# Patient Record
Sex: Female | Born: 1975 | Race: Black or African American | Hispanic: No | Marital: Married | Smoking: Never smoker
Health system: Southern US, Community
[De-identification: ages and names within clinical notes are randomized; demographics above are authoritative.]

## PROBLEM LIST (undated history)

## (undated) DIAGNOSIS — M545 Low back pain, unspecified: Secondary | ICD-10-CM

## (undated) DIAGNOSIS — R7301 Impaired fasting glucose: Secondary | ICD-10-CM

## (undated) DIAGNOSIS — E559 Vitamin D deficiency, unspecified: Secondary | ICD-10-CM

## (undated) DIAGNOSIS — E669 Obesity, unspecified: Secondary | ICD-10-CM

## (undated) DIAGNOSIS — Z803 Family history of malignant neoplasm of breast: Secondary | ICD-10-CM

## (undated) DIAGNOSIS — Z833 Family history of diabetes mellitus: Secondary | ICD-10-CM

## (undated) HISTORY — PX: OTHER SURGICAL HISTORY: SHX169

## (undated) HISTORY — DX: Family history of malignant neoplasm of breast: Z80.3

## (undated) HISTORY — DX: Low back pain, unspecified: M54.50

## (undated) HISTORY — DX: Impaired fasting glucose: R73.01

## (undated) HISTORY — DX: Obesity, unspecified: E66.9

## (undated) HISTORY — DX: Vitamin D deficiency, unspecified: E55.9

## (undated) HISTORY — DX: Low back pain: M54.5

## (undated) HISTORY — PX: PILONIDAL CYST EXCISION: SHX744

## (undated) HISTORY — DX: Morbid (severe) obesity due to excess calories: E66.01

## (undated) HISTORY — DX: Family history of diabetes mellitus: Z83.3

---

## 2011-01-05 ENCOUNTER — Ambulatory Visit: Payer: Self-pay | Admitting: Family Medicine

## 2011-02-05 ENCOUNTER — Ambulatory Visit: Payer: Self-pay | Admitting: Family Medicine

## 2011-05-21 ENCOUNTER — Observation Stay: Payer: Self-pay

## 2011-05-27 ENCOUNTER — Observation Stay: Payer: Self-pay

## 2011-06-02 ENCOUNTER — Inpatient Hospital Stay: Payer: Self-pay

## 2011-06-07 LAB — PATHOLOGY REPORT

## 2011-07-25 ENCOUNTER — Emergency Department: Payer: Self-pay | Admitting: Emergency Medicine

## 2011-08-20 ENCOUNTER — Ambulatory Visit: Payer: Self-pay | Admitting: Surgery

## 2011-08-26 ENCOUNTER — Ambulatory Visit: Payer: Self-pay | Admitting: Surgery

## 2011-08-26 HISTORY — PX: CHOLECYSTECTOMY: SHX55

## 2012-01-29 ENCOUNTER — Emergency Department: Payer: Self-pay | Admitting: *Deleted

## 2012-01-29 LAB — URINALYSIS, COMPLETE
Bilirubin,UR: NEGATIVE
Leukocyte Esterase: NEGATIVE
Ph: 5 (ref 4.5–8.0)
Protein: NEGATIVE
Specific Gravity: 1.021 (ref 1.003–1.030)
WBC UR: 1 /HPF (ref 0–5)

## 2012-01-29 LAB — CBC
MCHC: 33 g/dL (ref 32.0–36.0)
Platelet: 218 10*3/uL (ref 150–440)
RBC: 4.59 10*6/uL (ref 3.80–5.20)

## 2012-01-29 LAB — COMPREHENSIVE METABOLIC PANEL
Albumin: 3.6 g/dL (ref 3.4–5.0)
Alkaline Phosphatase: 84 U/L (ref 50–136)
Anion Gap: 10 (ref 7–16)
BUN: 8 mg/dL (ref 7–18)
Calcium, Total: 9.2 mg/dL (ref 8.5–10.1)
Co2: 22 mmol/L (ref 21–32)
Creatinine: 0.6 mg/dL (ref 0.60–1.30)
EGFR (Non-African Amer.): >60
Glucose: 76 mg/dL (ref 65–99)
Osmolality: 276 (ref 275–301)
SGOT(AST): 15 U/L (ref 15–37)
Sodium: 140 mmol/L (ref 136–145)
Total Protein: 7.8 g/dL (ref 6.4–8.2)

## 2012-02-06 ENCOUNTER — Emergency Department: Payer: Self-pay | Admitting: Emergency Medicine

## 2012-02-06 LAB — URINALYSIS, COMPLETE
Bilirubin,UR: NEGATIVE
Leukocyte Esterase: NEGATIVE
Ph: 6 (ref 4.5–8.0)
Protein: 30
RBC,UR: 240 /HPF (ref 0–5)
Squamous Epithelial: 1
Transitional Epi: 5

## 2012-02-06 LAB — COMPREHENSIVE METABOLIC PANEL
Alkaline Phosphatase: 83 U/L (ref 50–136)
Anion Gap: 11 (ref 7–16)
BUN: 11 mg/dL (ref 7–18)
Chloride: 103 mmol/L (ref 98–107)
EGFR (African American): >60
EGFR (Non-African Amer.): >60
Glucose: 117 mg/dL — ABNORMAL HIGH (ref 65–99)
Osmolality: 272 (ref 275–301)
SGOT(AST): 12 U/L — ABNORMAL LOW (ref 15–37)
SGPT (ALT): 19 U/L
Sodium: 136 mmol/L (ref 136–145)
Total Protein: 8 g/dL (ref 6.4–8.2)

## 2012-02-06 LAB — CBC
MCH: 28.6 pg (ref 26.0–34.0)
MCV: 85 fL (ref 80–100)
RBC: 4.48 10*6/uL (ref 3.80–5.20)
RDW: 14 % (ref 11.5–14.5)
WBC: 10.7 10*3/uL (ref 3.6–11.0)

## 2012-02-06 LAB — HCG, QUANTITATIVE, PREGNANCY: Beta Hcg, Quant.: 8078 m[IU]/mL — ABNORMAL HIGH

## 2012-02-28 ENCOUNTER — Emergency Department: Payer: Self-pay | Admitting: Emergency Medicine

## 2013-02-17 ENCOUNTER — Inpatient Hospital Stay: Payer: Self-pay | Admitting: Obstetrics and Gynecology

## 2013-02-17 LAB — CBC WITH DIFFERENTIAL/PLATELET
Eosinophil #: 0 10*3/uL (ref 0.0–0.7)
Eosinophil %: 0.4 %
HCT: 37 % (ref 35.0–47.0)
Lymphocyte #: 2 10*3/uL (ref 1.0–3.6)
Lymphocyte %: 18.4 %
MCH: 28.3 pg (ref 26.0–34.0)
MCHC: 33.7 g/dL (ref 32.0–36.0)
MCV: 84 fL (ref 80–100)
Monocyte %: 6.9 %
Neutrophil #: 8.2 10*3/uL — ABNORMAL HIGH (ref 1.4–6.5)
Neutrophil %: 74.1 %
WBC: 11.1 10*3/uL — ABNORMAL HIGH (ref 3.6–11.0)

## 2013-03-06 LAB — HM PAP SMEAR: HM Pap smear: NORMAL

## 2014-12-16 LAB — LIPID PANEL
Cholesterol: 144 mg/dL (ref 0–200)
HDL: 63 mg/dL (ref 35–70)
LDL Cholesterol: 69 mg/dL
TRIGLYCERIDES: 61 mg/dL (ref 40–160)

## 2014-12-16 LAB — HEMOGLOBIN A1C: HEMOGLOBIN A1C: 6.3 % — AB (ref 4.0–6.0)

## 2014-12-27 NOTE — Op Note (Signed)
PATIENT NAME:  Bridget Weiss, Torrey R MR#:  [id number] DATE OF BIRTH:  June 08, 1976  DATE OF PROCEDURE:  02/17/2013  PREOPERATIVE DIAGNOSES: Term intrauterine pregnancy, prior history of cesarean section, desire for permanent sterility.   POSTOPERATIVE DIAGNOSES: Term intrauterine pregnancy, prior history of cesarean section, desire for permanent sterility.   PROCEDURE: Low transverse cesarean section, bilateral tubal ligation.   SURGEON: Dierdre Searles. Paul Elham Fini, MD   ASSISTANT: Ronnald Rampammy Gordy, technician.   ANESTHESIA: Spinal.   ESTIMATED BLOOD LOSS: 500 mL.   COMPLICATIONS: None.   FINDINGS: Normal tubes, ovaries and uterus. Viable female infant weighing 8 pounds, 6 ounces with Apgar scores of 9 and 9 at 1 and 5 minutes, respectively.   DISPOSITION: To recovery room in stable condition.   TECHNIQUE: The patient is prepped and draped in the usual sterile fashion and after adequate anesthesia is obtained in the supine position on the Operating Room table, scalpel is used to create a low transverse skin incision down to the level of the rectus fascia, which is then dissected bilaterally using Mayo scissors. The rectus muscles are then dissected away from the rectus fascia and separated in the midline. The peritoneum is penetrated and the bladder is inferiorly retracted. A scalpel is used to create a low transverse hysterotomy incision that is then extended by blunt dissection. Amniotomy reveals light staining with meconium fluid. The infant's head is grasped and delivered without the use of a vacuum device and no nuchal cord is noted. The remaining infant is quickly delivered with the cord clamped and cut and the infant handed to the pediatric team for resuscitative purposes.   Cord blood is obtained. The placenta is manually extracted. The uterus is externalized and cleansed of all membranes and debris using a moist sponge. The hysterotomy incision is closed with a running #1 Vicryl suture in a locking fashion,  followed by a second layer to imbricate the first layer.   The right and left fallopian tubes are grasped in the mid portion with Babcock clamp and a loop is tied with 2 Vicryl sutures, excised and cauterized. Excellent hemostasis is noted.   The uterus is placed back in the intra-abdominal cavity and the paracolic gutters are irrigated using warm saline. Re-examination of all incisions, including tubal incisions, reveals excellent hemostasis. The peritoneum is closed with a Vicryl suture and the fascia is closed with a Maxon suture. Subcutaneous tissues are irrigated and hemostasis is assured using electrocautery. The subcutaneous layer is closed with a plain gut suture and the skin with surgical clips. The patient goes to the recovery room in stable condition. All sponge, instrument and needle counts are correct.    ____________________________ R. Annamarie MajorPaul Jennifr Gaeta, MD rph:jm D: 02/17/2013 07:57:22 ET T: 02/17/2013 15:13:37 ET JOB#: [id number]  cc: Dierdre Searles. Paul Channelle Bottger, MD, <Dictator> Nadara MustardOBERT P Taliah Porche MD ELECTRONICALLY SIGNED 03/05/2013 6:42

## 2015-01-14 NOTE — H&P (Signed)
L&D Evaluation:  History Expanded:  HPI 39 yo G3P1011 AAF at term w estimated date of confinement 02/25/13 w prior Cesarean Section as well as obesity and GDM with scheduled Cesarean Section 6/20 who presents w contractions this am getting stronger and more painful. Denies ROM or vaginal bleeding.  B[facility].   Gravida 3   Term 1   PreTerm 0   Abortion 1   Living 1   Blood Type (Maternal) B positive   Rubella Results (Maternal) equivocal   Presents with contractions   Patient's Medical History Obesity   Patient's Surgical History Pilonidal cyst   Medications Pre Natal Vitamins   Allergies Sulfa   Social History none   Family History Non-Contributory   ROS:  ROS All systems were reviewed.  HEENT, CNS, GI, GU, Respiratory, CV, Renal and Musculoskeletal systems were found to be normal.   Exam:  Vital Signs stable   General no apparent distress   Mental Status clear   Chest clear   Heart normal sinus rhythm, no murmur/gallop/rubs   Abdomen gravid, tender with contractions   Estimated Fetal Weight Average for gestational age   Back no CVAT   Edema no edema   Pelvic no external lesions, cervix closed and thick   Mebranes Intact   FHT normal rate with no decels   Ucx regular   Skin dry   Impression:  Impression early labor   Plan:  Plan EFM/NST, monitor contractions and for cervical change   Comments Pt has been fully informed of the pros and cons, risk/benefits of repeat Cesarean section versus VBAC.  The risks of a repeat Cesarean section include all the risks of major surgery, such as the risk of anesthesia, hemorrhage, infection, injury to adjacent organs-bowel, bladder and blood vessels.  The risks of attempting a VBAC include a 1 in 100 risk of uterine rupture with resultant potentially severe fetal/maternal complications and an increased risk of Cesarean complications should she not be successful.  After carefully weighing the pros and cons, the patient strongly requests a repeat Cesarean section   Electronic Signatures: Letitia LibraHarris, Robert Paul (MD)  (Signed 14-Jun-14 06:22)  Authored: L&D Evaluation   Last Updated: 14-Jun-14 06:22 by Letitia LibraHarris, Robert Paul (MD)

## 2015-07-10 ENCOUNTER — Ambulatory Visit (INDEPENDENT_AMBULATORY_CARE_PROVIDER_SITE_OTHER): Payer: BC Managed Care – PPO | Admitting: Family Medicine

## 2015-07-10 ENCOUNTER — Encounter: Payer: Self-pay | Admitting: Family Medicine

## 2015-07-10 VITALS — BP 134/88 | HR 81 | Temp 98.3°F | Resp 18 | Ht 70.0 in | Wt 311.0 lb

## 2015-07-10 DIAGNOSIS — E669 Obesity, unspecified: Secondary | ICD-10-CM | POA: Insufficient documentation

## 2015-07-10 DIAGNOSIS — H6092 Unspecified otitis externa, left ear: Secondary | ICD-10-CM | POA: Diagnosis not present

## 2015-07-10 DIAGNOSIS — I152 Hypertension secondary to endocrine disorders: Secondary | ICD-10-CM | POA: Insufficient documentation

## 2015-07-10 DIAGNOSIS — M545 Low back pain, unspecified: Secondary | ICD-10-CM | POA: Insufficient documentation

## 2015-07-10 DIAGNOSIS — E559 Vitamin D deficiency, unspecified: Secondary | ICD-10-CM | POA: Insufficient documentation

## 2015-07-10 DIAGNOSIS — Z862 Personal history of diseases of the blood and blood-forming organs and certain disorders involving the immune mechanism: Secondary | ICD-10-CM | POA: Insufficient documentation

## 2015-07-10 DIAGNOSIS — E1169 Type 2 diabetes mellitus with other specified complication: Secondary | ICD-10-CM | POA: Insufficient documentation

## 2015-07-10 DIAGNOSIS — R7301 Impaired fasting glucose: Secondary | ICD-10-CM | POA: Insufficient documentation

## 2015-07-10 DIAGNOSIS — Z833 Family history of diabetes mellitus: Secondary | ICD-10-CM | POA: Insufficient documentation

## 2015-07-10 DIAGNOSIS — Z803 Family history of malignant neoplasm of breast: Secondary | ICD-10-CM | POA: Insufficient documentation

## 2015-07-10 MED ORDER — CIPROFLOXACIN-HYDROCORTISONE 0.2-1 % OT SUSP: 3.0000 [drp] | Freq: Two times a day (BID) | OTIC | Status: DC

## 2015-07-10 NOTE — Progress Notes (Signed)
Name: Bridget Weiss   MRN: [id number]    DOB: Jan 23, 1976   Date:07/10/2015       Progress Note  Subjective  Chief Complaint  Chief Complaint  Patient presents with  . Otalgia    Left Ear Ache Onset- Monday and getting worst, just on left ear and having pressure on left side of jaw. Has tried Tea Tree Oil and put on a cotton ball and put in her ear, and Tylenol.    HPI  Otitis Externa: she states past few days with left ear pain, even to light touch, left side. 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  No fever. Left side feels muffled and also has some crusting on the left side of ear when she woke up. Right now pain is zero, but with pressure can go to 7/10.   Patient Active Problem List   Diagnosis Date Noted  . Elevated fasting blood sugar 07/10/2015  . Family history of breast cancer 07/10/2015  . Family history of diabetes mellitus 07/10/2015  . LBP (low back pain) 07/10/2015  . Morbid obesity (HCC) 07/10/2015  . Vitamin D deficiency 07/10/2015  . History of leukocytosis 07/10/2015    Past Surgical History  Procedure Laterality Date  . Abscess on buttock      had to be drained at the OR  . Cesarean section  06/03/2011  . Cholecystectomy  08/26/2011    Family History  Problem Relation Age of Onset  . Cancer Mother     Breast-Diagnosed in 2011  . Diabetes Mother   . Diabetes Maternal Aunt   . Diabetes Maternal Uncle     Social History   Social History  . Marital Status: Divorced    Spouse Name: N/A  . Number of Children: N/A  . Years of Education: N/A   Occupational History  . Not on file.   Social History Main Topics  . Smoking status: Never Smoker   . Smokeless tobacco: Never Used  . Alcohol Use: 0.0 oz/week    0 Standard drinks or equivalent per week     Comment: occassionally  . Drug Use: No  . Sexual Activity:    Partners: Female    Pharmacist, hospital Protection: Surgical     Comment: Tubal Ligation   Other Topics Concern  . Not on file    Social History Narrative  . No narrative on file     Current outpatient prescriptions:  .  ciprofloxacin-hydrocortisone (CIPRO HC) otic suspension, Place 3 drops into the left ear 2 (two) times daily., Disp: 10 mL, Rfl: 0  Allergies  Allergen Reactions  . Sulfamethoxazole-Trimethoprim Other (See Comments) and Nausea And Vomiting     ROS  Ten systems reviewed and is negative except as mentioned in HPI   Objective  Filed Vitals:   07/10/15 1026  BP: 134/88  Pulse: 81  Temp: 98.3 F (36.8 C)  TempSrc: Oral  Resp: 18  Height:  (1.778 m)  Weight: 311 lb (141.069 kg)  SpO2: 98%    Body mass index is 44.62 kg/(m^2).  Physical Exam  Constitutional: Patient appears well-developed and well-nourished. Obese  No distress.  HEENT: head atraumatic, normocephalic, pupils equal and reactive to light, ears normal TM's, tender during movement , ear canal swollen, tender no redness, mild white spot on superior part of ear canal,  neck supple, negative for tender lymphadenopathy, mild pain with abduction of jaw,  Tonsils are 3 plus bilaterally, no erythema or exsudate Cardiovascular: Normal rate,  regular rhythm and normal heart sounds.  No murmur heard. No BLE edema. Pulmonary/Chest: Effort normal and breath sounds normal. No respiratory distress. Abdominal: Soft.  There is no tenderness. Psychiatric: Patient has a normal mood and affect. behavior is normal. Judgment and thought content normal.  PHQ2/9: Depression screen PHQ 2/9 07/10/2015  Decreased Interest 0  Down, Depressed, Hopeless 0  PHQ - 2 Score 0   Fall Risk: Fall Risk  07/10/2015  Falls in the past year? No    Functional Status Survey: Is the patient deaf or have difficulty hearing?: No Does the patient have difficulty seeing, even when wearing glasses/contacts?: No Does the patient have difficulty concentrating, remembering, or making decisions?: No Does the patient have difficulty walking or climbing  stairs?: No Does the patient have difficulty dressing or bathing?: No Does the patient have difficulty doing errands alone such as visiting a doctor's office or shopping?: No   Assessment & Plan  1. Otitis externa, left  - ciprofloxacin-hydrocortisone (CIPRO HC) otic suspension; Place 3 drops into the left ear 2 (two) times daily.  Dispense: 10 mL; Refill: 0  2. History of leukocytosis  She wants to hold off on checking blood work today

## 2015-07-11 LAB — CBC WITH DIFFERENTIAL/PLATELET
BASOS ABS: 0 10*3/uL (ref 0.0–0.2)
Basos: 0 %
EOS (ABSOLUTE): 0.1 10*3/uL (ref 0.0–0.4)
Eos: 1 %
HEMOGLOBIN: 12.8 g/dL (ref 11.1–15.9)
Hematocrit: 38.1 % (ref 34.0–46.6)
Immature Grans (Abs): 0 10*3/uL (ref 0.0–0.1)
Immature Granulocytes: 0 %
Lymphocytes Absolute: 2 10*3/uL (ref 0.7–3.1)
Lymphs: 24 %
MCH: 27.6 pg (ref 26.6–33.0)
MCHC: 33.6 g/dL (ref 31.5–35.7)
MCV: 82 fL (ref 79–97)
MONOCYTES: 6 %
Monocytes Absolute: 0.5 10*3/uL (ref 0.1–0.9)
Neutrophils Absolute: 5.8 10*3/uL (ref 1.4–7.0)
Neutrophils: 69 %
PLATELETS: 228 10*3/uL (ref 150–379)
RBC: 4.63 x10E6/uL (ref 3.77–5.28)
RDW: 13.5 % (ref 12.3–15.4)
WBC: 8.3 10*3/uL (ref 3.4–10.8)

## 2017-04-27 ENCOUNTER — Emergency Department: Payer: BC Managed Care – PPO

## 2017-04-27 ENCOUNTER — Encounter: Payer: Self-pay | Admitting: Emergency Medicine

## 2017-04-27 ENCOUNTER — Emergency Department
Admission: EM | Admit: 2017-04-27 | Discharge: 2017-04-27 | Disposition: A | Discharge status: Home / Self Care | Payer: BC Managed Care – PPO | Attending: Emergency Medicine | Admitting: Emergency Medicine

## 2017-04-27 DIAGNOSIS — R079 Chest pain, unspecified: Secondary | ICD-10-CM | POA: Diagnosis present

## 2017-04-27 LAB — CBC
HCT: 38.1 % (ref 35.0–47.0)
HEMOGLOBIN: 12.8 g/dL (ref 12.0–16.0)
MCH: 27.7 pg (ref 26.0–34.0)
MCHC: 33.5 g/dL (ref 32.0–36.0)
MCV: 82.8 fL (ref 80.0–100.0)
PLATELETS: 186 10*3/uL (ref 150–440)
RBC: 4.6 MIL/uL (ref 3.80–5.20)
RDW: 14.4 % (ref 11.5–14.5)
WBC: 7 10*3/uL (ref 3.6–11.0)

## 2017-04-27 LAB — COMPREHENSIVE METABOLIC PANEL
ALBUMIN: 3.8 g/dL (ref 3.5–5.0)
ALK PHOS: 66 U/L (ref 38–126)
ALT: 18 U/L (ref 14–54)
ANION GAP: 6 (ref 5–15)
AST: 23 U/L (ref 15–41)
BUN: 11 mg/dL (ref 6–20)
CALCIUM: 8.9 mg/dL (ref 8.9–10.3)
CO2: 24 mmol/L (ref 22–32)
Chloride: 106 mmol/L (ref 101–111)
Creatinine, Ser: 0.49 mg/dL (ref 0.44–1.00)
GFR calc non Af Amer: >60 mL/min (ref >60)
GLUCOSE: 109 mg/dL — AB (ref 65–99)
POTASSIUM: 4.3 mmol/L (ref 3.5–5.1)
SODIUM: 136 mmol/L (ref 135–145)
Total Bilirubin: 0.8 mg/dL (ref 0.3–1.2)
Total Protein: 7.6 g/dL (ref 6.5–8.1)

## 2017-04-27 LAB — LIPASE, BLOOD: Lipase: 27 U/L (ref 11–51)

## 2017-04-27 LAB — FIBRIN DERIVATIVES D-DIMER (ARMC ONLY): FIBRIN DERIVATIVES D-DIMER (ARMC): 481.86 (ref 0.00–499.00)

## 2017-04-27 LAB — TROPONIN I: Troponin I: <0.03 ng/mL (ref <0.03)

## 2017-04-27 MED ORDER — FAMOTIDINE 20 MG PO TABS: 20.0000 mg | ORAL_TABLET | Freq: Two times a day (BID) | ORAL | 1 refills | Status: DC

## 2017-04-27 MED ORDER — OXYCODONE-ACETAMINOPHEN 5-325 MG PO TABS
2.0000 | ORAL_TABLET | Freq: Once | ORAL | Status: AC
  Administered 2017-04-27: 2 via ORAL
  Filled 2017-04-27: qty 2

## 2017-04-27 NOTE — ED Notes (Signed)
Patient transported to radiology at this time. 

## 2017-04-27 NOTE — ED Notes (Signed)
Attempted IV access x1. Vernona Rieger, RN attempted IV access x1. Kathlene November, ED Medic and Orvilla Fus, ED Medic at bedside to attempt IV access.

## 2017-04-27 NOTE — ED Notes (Signed)
Medics attempted IV access x2. Unsuccessful. Will wait until blood work results until further attempts. IV [place] will be required if so.

## 2017-04-27 NOTE — ED Notes (Signed)
Lab called and notified of need of blood draw. States they will send someone down as soon as possible.

## 2017-04-27 NOTE — ED Triage Notes (Signed)
Patient presents to ED via [facility] from home with c/o sudden onset of CP that began at 0700 this morning. Patient reports pressure under left breast that radiates into back. A&O x4. Patient states the pain started when he was carrying her 39 year old daughter.

## 2017-04-27 NOTE — ED Provider Notes (Signed)
[facility] Emergency Department Provider Note       Time seen: ----------------------------------------- 7:50 AM on 04/27/2017 -----------------------------------------     I have reviewed the triage vital signs and the nursing notes.   HISTORY   Chief Complaint Chest Pain    HPI Bridget Weiss is a 41 y.o. female who presents to the ED for sudden onset of chest pain that began around 7:00 this morning. She describes as pressure that radiates from the left side of her chest more to the center of her chest. She also has had it radiate into the back. She's never had it happen before, movement seems to make it worse. She states the pain started while she was carrying her 28-year-old daughter. She denies any recent illness or other complaints.   Past Medical History:  Diagnosis Date  . Abnormal fasting glucose   . Family history of breast cancer   . Family history of diabetes mellitus   . High blood pressure   . Intermittent low back pain   . Obesity, morbid (HCC)   . Vitamin D deficiency     Patient Active Problem List   Diagnosis Date Noted  . Elevated fasting blood sugar 07/10/2015  . Family history of breast cancer 07/10/2015  . Family history of diabetes mellitus 07/10/2015  . LBP (low back pain) 07/10/2015  . Morbid obesity (HCC) 07/10/2015  . Vitamin D deficiency 07/10/2015  . History of leukocytosis 07/10/2015    Past Surgical History:  Procedure Laterality Date  . abscess on buttock     had to be drained at the OR  . CESAREAN SECTION  06/03/2011  . CHOLECYSTECTOMY  08/26/2011    Allergies Sulfamethoxazole-trimethoprim  Social History Social History  Substance Use Topics  . Smoking status: Never Smoker  . Smokeless tobacco: Never Used  . Alcohol use 0.0 oz/week     Comment: occassionally    Review of Systems Constitutional: Negative for fever. Eyes: Negative for vision changes ENT:  Negative for congestion, sore  throat Cardiovascular: Positive for chest pain Respiratory: Negative for shortness of breath. Gastrointestinal: Negative for abdominal pain, vomiting and diarrhea. Genitourinary: Negative for dysuria. Musculoskeletal: Negative for back pain. Skin: Negative for rash. Neurological: Negative for headaches, focal weakness or numbness.  All systems negative/normal/unremarkable except as stated in the HPI  ____________________________________________   PHYSICAL EXAM:  VITAL SIGNS: ED Triage Vitals  Enc Vitals Group     BP 04/27/17 0745 (!) 156/104     Pulse Rate 04/27/17 0745 78     Resp 04/27/17 0745 17     Temp 04/27/17 0749 98.4 F (36.9 C)     Temp Source 04/27/17 0749 Oral     SpO2 04/27/17 0745 97 %     Weight 04/27/17 0746 (!) 310 lb (140.6 kg)     Height 04/27/17 0746 5\' 9"  (1.753 m)     Head Circumference --      Peak Flow --      Pain Score 04/27/17 0745 5     Pain Loc --      Pain Edu? --      Excl. in GC? --     Constitutional: Alert and oriented. Well appearing and in no distress. Eyes: Conjunctivae are normal. Normal extraocular movements. ENT   Head: Normocephalic and atraumatic.   Nose: No congestion/rhinnorhea.   Mouth/Throat: Mucous membranes are moist.   Neck: No stridor. Cardiovascular: Normal rate, regular rhythm. No murmurs, rubs, or gallops. Respiratory: Normal respiratory  effort without tachypnea nor retractions. Breath sounds are clear and equal bilaterally. No wheezes/rales/rhonchi. Gastrointestinal: Soft and nontender. Normal bowel sounds Musculoskeletal: Nontender with normal range of motion in extremities. No lower extremity tenderness nor edema. Neurologic:  Normal speech and language. No gross focal neurologic deficits are appreciated.  Skin:  Skin is warm, dry and intact. No rash noted. Psychiatric: Mood and affect are normal. Speech and behavior are normal.  ____________________________________________  EKG: Interpreted by  me.Sinus rhythm rate of 74 bpm, normal PR interval, normal QRS, normal QT.  ____________________________________________  ED COURSE:  Pertinent labs & imaging results that were available during my care of the patient were reviewed by me and considered in my medical decision making (see chart for details). Patient presents for chest pain, we will assess with labs and imaging as indicated.   Procedures ____________________________________________   LABS (pertinent positives/negatives)  Labs Reviewed  COMPREHENSIVE METABOLIC PANEL - Abnormal; Notable for the following:       Result Value   Glucose, Bld 109 (*)    All other components within normal limits  TROPONIN I  LIPASE, BLOOD  CBC  FIBRIN DERIVATIVES D-DIMER ([facility] ONLY)    RADIOLOGY  Chest x-ray Is unremarkable ____________________________________________  FINAL ASSESSMENT AND PLAN  Chest pain  Plan: Patient's labs and imaging were dictated above. Patient had presented for chest pain of uncertain etiology. Labs and workup here were reassuring and she currently has resolution of her symptoms. She is stable for outpatient follow-up.   Emily Filbert, MD   Note: This note was generated in part or whole with voice recognition software. Voice recognition is usually quite accurate but there are transcription errors that can and very often do occur. I apologize for any typographical errors that were not detected and corrected.     Emily Filbert, MD 04/27/17 1048

## 2017-04-29 ENCOUNTER — Ambulatory Visit (INDEPENDENT_AMBULATORY_CARE_PROVIDER_SITE_OTHER): Payer: BC Managed Care – PPO | Admitting: Family Medicine

## 2017-04-29 ENCOUNTER — Encounter: Payer: Self-pay | Admitting: Family Medicine

## 2017-04-29 VITALS — BP 130/76 | HR 90 | Temp 98.0°F | Resp 16 | Ht 70.0 in | Wt 313.3 lb

## 2017-04-29 DIAGNOSIS — R739 Hyperglycemia, unspecified: Secondary | ICD-10-CM | POA: Diagnosis not present

## 2017-04-29 DIAGNOSIS — R0789 Other chest pain: Secondary | ICD-10-CM

## 2017-04-29 NOTE — Patient Instructions (Signed)

## 2017-04-29 NOTE — Progress Notes (Signed)
Name: Bridget Weiss   MRN: [id number]    DOB: Oct 30, 1975   Date:04/29/2017       Progress Note  Subjective  Chief Complaint  Chief Complaint  Patient presents with  . Hospitalization Follow-up    follow up for chest pain      HPI  Chest pain: she went to [facility] on 08/22 after acute onset of severe left side chest pain that went around her chest, when she picked up her 39 yo daughter. It was continues and intense, not associated with diaphoresis or nausea or vomiting, pain was aggravated by movement and had some bloating when pain first happened. At [facility] labs were negative, normal troponin, D-dimer, CBC, lipase and kidney and liver function. CXR and EKG's also normal. She was given two of percocet at [facility] and symptoms resolved, and was told to take Pepcid at home. She states right now she has mild discomfort but feeling much better.   Obesity/ she has hyperglycemia and a long history of obesity, she denies polyphagia, polydipsia or polyuria. She tries to have heaviest meal for lunch. Eating smaller portions at night.   Patient Active Problem List   Diagnosis Date Noted  . Elevated fasting blood sugar 07/10/2015  . Family history of breast cancer 07/10/2015  . Family history of diabetes mellitus 07/10/2015  . Morbid obesity ([place]) 07/10/2015  . Vitamin D deficiency 07/10/2015    Past Surgical History:  Procedure Laterality Date  . abscess on buttock     had to be drained at the OR  . CESAREAN SECTION  06/03/2011  . CHOLECYSTECTOMY  08/26/2011    Family History  Problem Relation Age of Onset  . Cancer Mother        Breast-Diagnosed in 2011  . Diabetes Mother   . Diabetes Maternal Aunt   . Diabetes Maternal Uncle     Social History   Social History  . Marital status: Married    Spouse name: Antonio   . Number of children: 2  . Years of education: N/A   Occupational History  . administrative specialist [employer]   Social History Main Topics  . Smoking status: Never Smoker  .  Smokeless tobacco: Never Used  . Alcohol use 0.0 oz/week     Comment: occassionally  . Drug use: No  . Sexual activity: Yes    Partners: Female    Birth control/ protection: Surgical     Comment: Tubal Ligation   Other Topics Concern  . Not on file   Social History Narrative   Re-married, her husband has two children from a previous relationship   They have two children together   Works at [employer] Prescriptions:  .  famotidine (PEPCID) 20 MG tablet, Take 1 tablet (20 mg total) by mouth 2 (two) times daily., Disp: 60 tablet, Rfl: 1 .  ciprofloxacin-hydrocortisone (CIPRO HC) otic suspension, Place 3 drops into the left ear 2 (two) times daily. (Patient not taking: Reported on 04/27/2017), Disp: 10 mL, Rfl: 0  Allergies  Allergen Reactions  . Sulfamethoxazole-Trimethoprim Other (See Comments) and Nausea And Vomiting     ROS  Constitutional: Negative for fever or weight change.  Respiratory: Negative for cough and shortness of breath.   Cardiovascular: Negative for chest pain ( intermittent discomfort)  or palpitations.  Gastrointestinal: Negative for abdominal pain, no bowel changes.  Musculoskeletal: Negative for gait problem or joint swelling.  Skin: Negative for rash.  Neurological: Negative for dizziness or headache.  No other specific complaints in a complete review of systems (except as listed in HPI above).  Objective  Vitals:   04/29/17 0812  BP: 130/76  Pulse: 90  Resp: 16  Temp: 98 F (36.7 C)  SpO2: 98%  Weight: (!) 313 lb 5 oz (142.1 kg)  Height: _0  (1.778 m)    Body mass index is 44.96 kg/m.  Physical Exam  Constitutional: Patient appears well-developed and well-nourished. Obese  No distress.  HEENT: head atraumatic, normocephalic, pupils equal and reactive to light,  neck supple, throat within normal limits Cardiovascular: Normal rate, regular rhythm and normal heart sounds.  No murmur heard. No BLE edema. Pulmonary/Chest:  Effort normal and breath sounds normal. No respiratory distress. Abdominal: Soft.  There is no tenderness. Psychiatric: Patient has a normal mood and affect. behavior is normal. Judgment and thought content normal. Muscular Skeletal: pain during palpation of left side of chest, from anterior axillary line to left anterior chest wall  Recent Results (from the past 2160 hour(s))  Troponin I     Status: None   Collection Time: 04/27/17  8:04 AM  Result Value Ref Range   Troponin I <0.03 <0.03 ng/mL  Comprehensive metabolic panel     Status: Abnormal   Collection Time: 04/27/17  8:04 AM  Result Value Ref Range   Sodium 136 135 - 145 mmol/L   Potassium 4.3 3.5 - 5.1 mmol/L   Chloride 106 101 - 111 mmol/L   CO2 24 22 - 32 mmol/L   Glucose, Bld 109 (H) 65 - 99 mg/dL   BUN 11 6 - 20 mg/dL   Creatinine, Ser 0.49 0.44 - 1.00 mg/dL   Calcium 8.9 8.9 - 10.3 mg/dL   Total Protein 7.6 6.5 - 8.1 g/dL   Albumin 3.8 3.5 - 5.0 g/dL   AST 23 15 - 41 U/L   ALT 18 14 - 54 U/L   Alkaline Phosphatase 66 38 - 126 U/L   Total Bilirubin 0.8 0.3 - 1.2 mg/dL   GFR calc non Af Amer >60 >60 mL/min   GFR calc Af Amer >60 >60 mL/min    Comment: (NOTE) The eGFR has been calculated using the CKD EPI equation. This calculation has not been validated in all clinical situations. eGFR's persistently <60 mL/min signify possible Chronic Kidney Disease.    Anion gap 6 5 - 15  Lipase, blood     Status: None   Collection Time: 04/27/17  8:04 AM  Result Value Ref Range   Lipase 27 11 - 51 U/L  CBC     Status: None   Collection Time: 04/27/17  9:42 AM  Result Value Ref Range   WBC 7.0 3.6 - 11.0 K/uL   RBC 4.60 3.80 - 5.20 MIL/uL   Hemoglobin 12.8 12.0 - 16.0 g/dL   HCT 38.1 35.0 - 47.0 %   MCV 82.8 80.0 - 100.0 fL   MCH 27.7 26.0 - 34.0 pg   MCHC 33.5 32.0 - 36.0 g/dL   RDW 14.4 11.5 - 14.5 %   Platelets 186 150 - 440 K/uL  Fibrin derivatives D-Dimer ([facility] only)     Status: None   Collection Time: 04/27/17   9:42 AM  Result Value Ref Range   Fibrin derivatives D-dimer ([facility]) 481.86 0.00 - 499.00    Comment: (NOTE) <> Exclusion of Venous Thromboembolism (VTE) - OUTPATIENT ONLY   (Emergency Department or [place])   0-499 ng/ml (FEU): With a low to intermediate pretest probability  for VTE this test result excludes the diagnosis                      of VTE.   >499 ng/ml (FEU) : VTE not excluded; additional work up for VTE is                      required. <> Testing on Inpatients and Evaluation of Disseminated Intravascular   Coagulation (DIC) Reference Range:   0-499 ng/ml (FEU)       PHQ2/9: Depression screen [facility] 2/9 04/29/2017 07/10/2015  Decreased Interest 0 0  Down, Depressed, Hopeless 0 0  PHQ - 2 Score 0 0     Fall Risk: Fall Risk  04/29/2017 07/10/2015  Falls in the past year? No No     Functional Status Survey: Is the patient deaf or have difficulty hearing?: No Does the patient have difficulty seeing, even when wearing glasses/contacts?: No Does the patient have difficulty concentrating, remembering, or making decisions?: No Does the patient have difficulty walking or climbing stairs?: No Does the patient have difficulty dressing or bathing?: No Does the patient have difficulty doing errands alone such as visiting a doctor's office or shopping?: No   Assessment & Plan   1. Other chest pain  Continue pepcid since it seems to have improved, it seems to be muscular, reproducible with palpitation, reviewed chart with patient, continue to monitor for now  2. Morbid obesity ([place])  Discussed with the patient the risk posed by an increased BMI. Discussed importance of portion control, calorie counting and at least 150 minutes of physical activity weekly. Avoid sweet beverages and drink more water. Eat at least 6 servings of fruit and vegetables daily   3. Hyperglycemia  - Hemoglobin A1c - Lipid panel - Insulin, fasting

## 2017-05-30 ENCOUNTER — Telehealth: Payer: Self-pay | Admitting: Family Medicine

## 2017-05-30 ENCOUNTER — Other Ambulatory Visit: Payer: Self-pay | Admitting: Family Medicine

## 2017-05-30 NOTE — Telephone Encounter (Signed)
Patient notified and labs are upfront. Ready for patient to have drawn.

## 2017-05-30 NOTE — Telephone Encounter (Signed)
I already ordered labs, they are probably pending.  I prefer ordering mammogram during her visit , just in case she has a lump

## 2017-05-30 NOTE — Telephone Encounter (Signed)
Pt has her annual physical for next week. She would like for you to order her lab work and mammogram before the appt.  [phone]

## 2017-06-04 LAB — HEMOGLOBIN A1C
Hgb A1c MFr Bld: 6 % of total Hgb — ABNORMAL HIGH (ref <5.7)
Mean Plasma Glucose: 126 (calc)
eAG (mmol/L): 7 (calc)

## 2017-06-04 LAB — LIPID PANEL
CHOL/HDL RATIO: 3 (calc) (ref <5.0)
CHOLESTEROL: 158 mg/dL (ref <200)
HDL: 53 mg/dL (ref >50)
LDL Cholesterol (Calc): 91 mg/dL (calc)
NON-HDL CHOLESTEROL (CALC): 105 mg/dL (ref <130)
TRIGLYCERIDES: 54 mg/dL (ref <150)

## 2017-06-06 LAB — INSULIN, RANDOM: INSULIN: 8 u[IU]/mL (ref 2.0–19.6)

## 2017-06-08 ENCOUNTER — Encounter: Payer: Self-pay | Admitting: Family Medicine

## 2017-06-08 ENCOUNTER — Ambulatory Visit (INDEPENDENT_AMBULATORY_CARE_PROVIDER_SITE_OTHER): Payer: BC Managed Care – PPO | Admitting: Family Medicine

## 2017-06-08 VITALS — BP 100/60 | HR 84 | Temp 98.6°F | Resp 14 | Ht 70.0 in | Wt 316.7 lb

## 2017-06-08 DIAGNOSIS — Z01419 Encounter for gynecological examination (general) (routine) without abnormal findings: Secondary | ICD-10-CM

## 2017-06-08 DIAGNOSIS — Z1239 Encounter for other screening for malignant neoplasm of breast: Secondary | ICD-10-CM

## 2017-06-08 DIAGNOSIS — R7303 Prediabetes: Secondary | ICD-10-CM

## 2017-06-08 DIAGNOSIS — Z1231 Encounter for screening mammogram for malignant neoplasm of breast: Secondary | ICD-10-CM | POA: Diagnosis not present

## 2017-06-08 DIAGNOSIS — Z803 Family history of malignant neoplasm of breast: Secondary | ICD-10-CM

## 2017-06-08 DIAGNOSIS — Z124 Encounter for screening for malignant neoplasm of cervix: Secondary | ICD-10-CM | POA: Diagnosis not present

## 2017-06-08 DIAGNOSIS — Z113 Encounter for screening for infections with a predominantly sexual mode of transmission: Secondary | ICD-10-CM

## 2017-06-08 MED ORDER — VITAMIN D 50 MCG (2000 UT) PO CAPS: 1.0000 | ORAL_CAPSULE | Freq: Every day | ORAL | 0 refills | Status: DC

## 2017-06-08 NOTE — Patient Instructions (Signed)
Diabetes Mellitus and Food It is important for you to manage your blood sugar (glucose) level. Your blood glucose level can be greatly affected by what you eat. Eating healthier foods in the appropriate amounts throughout the day at about the same time each day will help you control your blood glucose level. It can also help slow or prevent worsening of your diabetes mellitus. Healthy eating may even help you improve the level of your blood pressure and reach or maintain a healthy weight. General recommendations for healthful eating and cooking habits include:  Eating meals and snacks regularly. Avoid going long periods of time without eating to lose weight.  Eating a diet that consists mainly of plant-based foods, such as fruits, vegetables, nuts, legumes, and whole grains.  Using low-heat cooking methods, such as baking, instead of high-heat cooking methods, such as deep frying.  Work with your dietitian to make sure you understand how to use the Nutrition Facts information on food labels. How can food affect me? Carbohydrates Carbohydrates affect your blood glucose level more than any other type of food. Your dietitian will help you determine how many carbohydrates to eat at each meal and teach you how to count carbohydrates. Counting carbohydrates is important to keep your blood glucose at a healthy level, especially if you are using insulin or taking certain medicines for diabetes mellitus. Alcohol Alcohol can cause sudden decreases in blood glucose (hypoglycemia), especially if you use insulin or take certain medicines for diabetes mellitus. Hypoglycemia can be a life-threatening condition. Symptoms of hypoglycemia (sleepiness, dizziness, and disorientation) are similar to symptoms of having too much alcohol. If your health care provider has given you approval to drink alcohol, do so in moderation and use the following guidelines:  Women should not have more than one drink per day, and men  should not have more than two drinks per day. One drink is equal to: ? 12 oz of beer. ? 5 oz of wine. ? 1 oz of hard liquor.  Do not drink on an empty stomach.  Keep yourself hydrated. Have water, diet soda, or unsweetened iced tea.  Regular soda, juice, and other mixers might contain a lot of carbohydrates and should be counted.  What foods are not recommended? As you make food choices, it is important to remember that all foods are not the same. Some foods have fewer nutrients per serving than other foods, even though they might have the same number of calories or carbohydrates. It is difficult to get your body what it needs when you eat foods with fewer nutrients. Examples of foods that you should avoid that are high in calories and carbohydrates but low in nutrients include:  Trans fats (most processed foods list trans fats on the Nutrition Facts label).  Regular soda.  Juice.  Candy.  Sweets, such as cake, pie, doughnuts, and cookies.  Fried foods.  What foods can I eat? Eat nutrient-rich foods, which will nourish your body and keep you healthy. The food you should eat also will depend on several factors, including:  The calories you need.  The medicines you take.  Your weight.  Your blood glucose level.  Your blood pressure level.  Your cholesterol level.  You should eat a variety of foods, including:  Protein. ? Lean cuts of meat. ? Proteins low in saturated fats, such as fish, egg whites, and beans. Avoid processed meats.  Fruits and vegetables. ? Fruits and vegetables that may help control blood glucose levels, such as apples,   mangoes, and yams.  Dairy products. ? Choose fat-free or low-fat dairy products, such as milk, yogurt, and cheese.  Grains, bread, pasta, and rice. ? Choose whole grain products, such as multigrain bread, whole oats, and brown rice. These foods may help control blood pressure.  Fats. ? Foods containing healthful fats, such as  nuts, avocado, olive oil, canola oil, and fish.  Does everyone with diabetes mellitus have the same meal plan? Because every person with diabetes mellitus is different, there is not one meal plan that works for everyone. It is very important that you meet with a dietitian who will help you create a meal plan that is just right for you. This information is not intended to replace advice given to you by your health care provider. Make sure you discuss any questions you have with your health care provider. Document Released: 05/20/2005 Document Revised: 01/29/2016 Document Reviewed: 07/20/2013 Elsevier Interactive Patient Education  2017 [place] 18-39 Years, Female Preventive care refers to lifestyle choices and visits with your health care provider that can promote health and wellness. What does preventive care include?  A yearly physical exam. This is also called an annual well check.  Dental exams once or twice a year.  Routine eye exams. Ask your health care provider how often you should have your eyes checked.  Personal lifestyle choices, including: ? Daily care of your teeth and gums. ? Regular physical activity. ? Eating a healthy diet. ? Avoiding tobacco and drug use. ? Limiting alcohol use. ? Practicing safe sex. ? Taking vitamin and mineral supplements as recommended by your health care provider. What happens during an annual well check? The services and screenings done by your health care provider during your annual well check will depend on your age, overall health, lifestyle risk factors, and family history of disease. Counseling Your health care provider may ask you questions about your:  Alcohol use.  Tobacco use.  Drug use.  Emotional well-being.  Home and relationship well-being.  Sexual activity.  Eating habits.  Work and work Statistician.  Method of birth control.  Menstrual cycle.  Pregnancy history.  Screening You may have  the following tests or measurements:  Height, weight, and BMI.  Diabetes screening. This is done by checking your blood sugar (glucose) after you have not eaten for a while (fasting).  Blood pressure.  Lipid and cholesterol levels. These may be checked every 5 years starting at age 14.  Skin check.  Hepatitis C blood test.  Hepatitis B blood test.  Sexually transmitted disease (STD) testing.  BRCA-related cancer screening. This may be done if you have a family history of breast, ovarian, tubal, or peritoneal cancers.  Pelvic exam and Pap test. This may be done every 3 years starting at age 63. Starting at age 65, this may be done every 5 years if you have a Pap test in combination with an HPV test.  Discuss your test results, treatment options, and if necessary, the need for more tests with your health care provider. Vaccines Your health care provider may recommend certain vaccines, such as:  Influenza vaccine. This is recommended every year.  Tetanus, diphtheria, and acellular pertussis (Tdap, Td) vaccine. You may need a Td booster every 10 years.  Varicella vaccine. You may need this if you have not been vaccinated.  HPV vaccine. If you are 97 or younger, you may need three doses over 6 months.  Measles, mumps, and rubella (MMR) vaccine. You may need at  least one dose of MMR. You may also need a second dose.  Pneumococcal 13-valent conjugate (PCV13) vaccine. You may need this if you have certain conditions and were not previously vaccinated.  Pneumococcal polysaccharide (PPSV23) vaccine. You may need one or two doses if you smoke cigarettes or if you have certain conditions.  Meningococcal vaccine. One dose is recommended if you are age 33-21 years and a first-year college student living in a residence hall, or if you have one of several medical conditions. You may also need additional booster doses.  Hepatitis A vaccine. You may need this if you have certain conditions or  if you travel or work in places where you may be exposed to hepatitis A.  Hepatitis B vaccine. You may need this if you have certain conditions or if you travel or work in places where you may be exposed to hepatitis B.  Haemophilus influenzae type b (Hib) vaccine. You may need this if you have certain risk factors.  Talk to your health care provider about which screenings and vaccines you need and how often you need them. This information is not intended to replace advice given to you by your health care provider. Make sure you discuss any questions you have with your health care provider. Document Released: 10/19/2001 Document Revised: 05/12/2016 Document Reviewed: 06/24/2015 Elsevier Interactive Patient Education  2017 [employer].

## 2017-06-08 NOTE — Progress Notes (Addendum)
Name: Bridget Weiss   MRN: [id number]    DOB: Apr 13, 1976   Date:06/08/2017       Progress Note  Subjective  Chief Complaint  Chief Complaint  Patient presents with  . Annual Exam    HPI   Patient presents for annual CPE and review labs  Diet: she tries to eat lots of vegetables, she eats at work but tries to eat grilled food and vegetables/fruit, avoiding sweets  Current Exercise Habits: The patient does not participate in regular exercise at present     USPSTF grade A and B recommendations  Depression:  Depression screen [facility] 2/9 06/08/2017 04/29/2017 07/10/2015  Decreased Interest 0 0 0  Down, Depressed, Hopeless 0 0 0  PHQ - 2 Score 0 0 0  Altered sleeping 0 - -  Tired, decreased energy 0 - -  Change in appetite 0 - -  Feeling bad or failure about yourself  0 - -  Trouble concentrating 0 - -  Moving slowly or fidgety/restless 0 - -  Suicidal thoughts 0 - -  PHQ-9 Score 0 - -  Difficult doing work/chores Not difficult at all - -   Hypertension: BP Readings from Last 3 Encounters:  06/08/17 100/60  04/29/17 130/76  04/27/17 136/88   Obesity: Wt Readings from Last 3 Encounters:  06/08/17 (!) 316 lb 11.2 oz (143.7 kg)  04/29/17 (!) 313 lb 5 oz (142.1 kg)  04/27/17 (!) 310 lb (140.6 kg)   BMI Readings from Last 3 Encounters:  06/08/17 45.44 kg/m  04/29/17 44.96 kg/m  04/27/17 45.78 kg/m    Alcohol: only on weekends, two servings. Tobacco use: none HIV, hep B, hep C: she is sexually active and would like to have labs done STD testing and prevention (chl/gon/syphilis): we will check it Intimate partner violence:  Sexual History/Pain during Intercourse: Menstrual History/LMP: 05/28/2017, cycles are monthly but at times heavy and other times light.  Incontinence Symptoms: denies   Advanced Care Planning: A voluntary discussion about advance care planning including the explanation and discussion of advance directives.  Discussed health care proxy and Living  will, and the patient was able to identify a health care proxy as husband  Patient does not have a living will at present time. If patient does have living will, I have requested they bring this to the clinic to be scanned in to their chart.  Breast cancer: due for mammogram 10/2017 No results found for: [facility]  BRCA gene screening: she will find out if mother was tested before she died Cervical cancer screening: check today  Fall prevention/vitamin D: advised to resume supplementation  Lipids:  Lab Results  Component Value Date   CHOL 158 06/03/2017   CHOL 144 12/16/2014   Lab Results  Component Value Date   HDL 53 06/03/2017   HDL 63 12/16/2014   Lab Results  Component Value Date   LDLCALC 69 12/16/2014   Lab Results  Component Value Date   TRIG 54 06/03/2017   TRIG 61 12/16/2014   Lab Results  Component Value Date   CHOLHDL 3.0 06/03/2017   No results found for: LDLDIRECT  Glucose:  Glucose  Date Value Ref Range Status  02/06/2012 117 (H) 65 - 99 mg/dL Final  01/29/2012 76 65 - 99 mg/dL Final   Glucose, Bld  Date Value Ref Range Status  04/27/2017 109 (H) 65 - 99 mg/dL Final    Skin cancer: no atypical lesion Colorectal cancer: not due, too young,   years  old. Lung cancer:  Low Dose CT Chest recommended if Age 26-80 years, 30 pack-year currently smoking OR have quit w/in 15years. Patient does not qualify.   Aspirin: not a candidate   Patient Active Problem List   Diagnosis Date Noted  . Elevated fasting blood sugar 07/10/2015  . Family history of breast cancer 07/10/2015  . Family history of diabetes mellitus 07/10/2015  . Morbid obesity ([place]) 07/10/2015  . Vitamin D deficiency 07/10/2015    Past Surgical History:  Procedure Laterality Date  . abscess on buttock     had to be drained at the OR  . CESAREAN SECTION  06/03/2011  . CHOLECYSTECTOMY  08/26/2011    Family History  Problem Relation Age of Onset  . Cancer Mother         Breast-Diagnosed in 2011  . Diabetes Mother   . Diabetes Maternal Aunt   . Diabetes Maternal Uncle     Social History   Social History  . Marital status: Married    Spouse name: Antonio   . Number of children: 2  . Years of education: N/A   Occupational History  . administrative specialist [employer]   Social History Main Topics  . Smoking status: Never Smoker  . Smokeless tobacco: Never Used  . Alcohol use 0.0 oz/week     Comment: occassionally  . Drug use: No  . Sexual activity: Yes    Partners: Female    Birth control/ protection: Surgical     Comment: Tubal Ligation   Other Topics Concern  . Not on file   Social History Narrative   Re-married, her husband has two children from a previous relationship   They have two children together   Works at [employer] Prescriptions:  .  Cholecalciferol (VITAMIN D) 2000 units CAPS, Take 1 capsule (2,000 Units total) by mouth daily., Disp: 30 capsule, Rfl: 0  Allergies  Allergen Reactions  . Sulfamethoxazole-Trimethoprim Other (See Comments) and Nausea And Vomiting     ROS   Constitutional: Negative for fever or weight change.  Respiratory: Negative for cough and shortness of breath.   Cardiovascular: Negative for chest pain or palpitations.  Gastrointestinal: Negative for abdominal pain, no bowel changes.  Musculoskeletal: Negative for gait problem or joint swelling.  Skin: Negative for rash.  Neurological: Negative for dizziness or headache.  No other specific complaints in a complete review of systems (except as listed in HPI above).   Objective  Vitals:   06/08/17 1115  BP: 100/60  Pulse: 84  Resp: 14  Temp: 98.6 F (37 C)  TempSrc: Oral  SpO2: 98%  Weight: (!) 316 lb 11.2 oz (143.7 kg)  Height: '5\' 10"'  (1.778 m)    Body mass index is 45.44 kg/m.  Physical Exam  Constitutional: Patient appears well-developed and well-nourished. No distress.  HENT: Head: Normocephalic and atraumatic.  Ears: B TMs ok, no erythema or effusion; Nose: Nose normal. Sty on left eye  Mouth/Throat: Oropharynx is clear and moist. No oropharyngeal exudate.  Eyes: Conjunctivae and EOM are normal. Pupils are equal, round, and reactive to light. No scleral icterus.  Neck: Normal range of motion. Neck supple. No JVD present. No thyromegaly present.  Cardiovascular: Normal rate, regular rhythm and normal heart sounds.  No murmur heard. No BLE edema. Pulmonary/Chest: Effort normal and breath sounds normal. No respiratory distress. Abdominal: Soft. Bowel sounds are normal, no distension. There is no tenderness. no masses Breast: no lumps or masses, no nipple  discharge or rashes FEMALE GENITALIA:  External genitalia normal External urethra normal Vaginal vault normal without discharge or lesions Cervix normal without discharge or lesions Bimanual exam normal without masses RECTAL: not done Musculoskeletal: Normal range of motion, no joint effusions. No gross deformities Neurological: he is alert and oriented to person, place, and time. No cranial nerve deficit. Coordination, balance, strength, speech and gait are normal.  Skin: Skin is warm and dry. No rash noted. No erythema.  Psychiatric: Patient has a normal mood and affect. behavior is normal. Judgment and thought content normal.   Recent Results (from the past 2160 hour(s))  Troponin I     Status: None   Collection Time: 04/27/17  8:04 AM  Result Value Ref Range   Troponin I <0.03 <0.03 ng/mL  Comprehensive metabolic panel     Status: Abnormal   Collection Time: 04/27/17  8:04 AM  Result Value Ref Range   Sodium 136 135 - 145 mmol/L   Potassium 4.3 3.5 - 5.1 mmol/L   Chloride 106 101 - 111 mmol/L   CO2 24 22 - 32 mmol/L   Glucose, Bld 109 (H) 65 - 99 mg/dL   BUN 11 6 - 20 mg/dL   Creatinine, Ser 0.49 0.44 - 1.00 mg/dL   Calcium 8.9 8.9 - 10.3 mg/dL   Total Protein 7.6 6.5 - 8.1 g/dL   Albumin 3.8 3.5 - 5.0 g/dL   AST 23 15 - 41 U/L   ALT  18 14 - 54 U/L   Alkaline Phosphatase 66 38 - 126 U/L   Total Bilirubin 0.8 0.3 - 1.2 mg/dL   GFR calc non Af Amer >60 >60 mL/min   GFR calc Af Amer >60 >60 mL/min    Comment: (NOTE) The eGFR has been calculated using the CKD EPI equation. This calculation has not been validated in all clinical situations. eGFR's persistently <60 mL/min signify possible Chronic Kidney Disease.    Anion gap 6 5 - 15  Lipase, blood     Status: None   Collection Time: 04/27/17  8:04 AM  Result Value Ref Range   Lipase 27 11 - 51 U/L  CBC     Status: None   Collection Time: 04/27/17  9:42 AM  Result Value Ref Range   WBC 7.0 3.6 - 11.0 K/uL   RBC 4.60 3.80 - 5.20 MIL/uL   Hemoglobin 12.8 12.0 - 16.0 g/dL   HCT 38.1 35.0 - 47.0 %   MCV 82.8 80.0 - 100.0 fL   MCH 27.7 26.0 - 34.0 pg   MCHC 33.5 32.0 - 36.0 g/dL   RDW 14.4 11.5 - 14.5 %   Platelets 186 150 - 440 K/uL  Fibrin derivatives D-Dimer ([facility] only)     Status: None   Collection Time: 04/27/17  9:42 AM  Result Value Ref Range   Fibrin derivatives D-dimer ([facility]) 481.86 0.00 - 499.00    Comment: (NOTE) <> Exclusion of Venous Thromboembolism (VTE) - OUTPATIENT ONLY   (Emergency Department or [place])   0-499 ng/ml (FEU): With a low to intermediate pretest probability                      for VTE this test result excludes the diagnosis                      of VTE.   >499 ng/ml (FEU) : VTE not excluded; additional work up for VTE is  required. <> Testing on Inpatients and Evaluation of Disseminated Intravascular   Coagulation ([facility]) Reference Range:   0-499 ng/ml (FEU)   Hemoglobin A1c     Status: Abnormal   Collection Time: 06/03/17  9:39 AM  Result Value Ref Range   Hgb A1c MFr Bld 6.0 (H) <5.7 % of total Hgb    Comment: For someone without known diabetes, a hemoglobin  A1c value between 5.7% and 6.4% is consistent with prediabetes and should be confirmed with a  follow-up test. . For someone with known diabetes, a  value <7% indicates that their diabetes is well controlled. A1c targets should be individualized based on duration of diabetes, age, comorbid conditions, and other considerations. . This assay result is consistent with an increased risk of diabetes. . Currently, no consensus exists regarding use of hemoglobin A1c for diagnosis of diabetes for children. .    Mean Plasma Glucose 126 (calc)   eAG (mmol/L) 7.0 (calc)  Lipid panel     Status: None   Collection Time: 06/03/17  9:39 AM  Result Value Ref Range   Cholesterol 158 <200 mg/dL   HDL 53 >50 mg/dL   Triglycerides 54 <150 mg/dL   LDL Cholesterol (Calc) 91 mg/dL (calc)    Comment: Reference range: <100 . Desirable range <100 mg/dL for primary prevention;   <70 mg/dL for patients with CHD or diabetic patients  with > or = 2 CHD risk factors. Marland Kitchen LDL-C is now calculated using the Martin-Hopkins  calculation, which is a validated novel method providing  better accuracy than the Friedewald equation in the  estimation of LDL-C.  Cresenciano Genre et al. Annamaria Helling. 9983;382(50): 2061-2068  (http://education.[facility].com/faq/FAQ164)    Total CHOL/HDL Ratio 3.0 <5.0 (calc)   Non-HDL Cholesterol (Calc) 105 <130 mg/dL (calc)    Comment: For patients with diabetes plus 1 major ASCVD risk  factor, treating to a non-HDL-C goal of <100 mg/dL  (LDL-C of <70 mg/dL) is considered a therapeutic  option.   Insulin, random     Status: None   Collection Time: 06/03/17  9:39 AM  Result Value Ref Range   Insulin 8.0 2.0 - 19.6 uIU/mL    Comment: This insulin assay shows strong cross-reactivity for some insulin analogs (lispro, aspart, and glargine) and much lower cross-reactivity with others (detemir, glulisine).      PHQ2/9: Depression screen [facility] 2/9 06/08/2017 04/29/2017 07/10/2015  Decreased Interest 0 0 0  Down, Depressed, Hopeless 0 0 0  PHQ - 2 Score 0 0 0  Altered sleeping 0 - -  Tired, decreased energy 0 - -  Change in appetite 0  - -  Feeling bad or failure about yourself  0 - -  Trouble concentrating 0 - -  Moving slowly or fidgety/restless 0 - -  Suicidal thoughts 0 - -  PHQ-9 Score 0 - -  Difficult doing work/chores Not difficult at all - -    Fall Risk: Fall Risk  04/29/2017 07/10/2015  Falls in the past year? No No    Functional Status Survey: Is the patient deaf or have difficulty hearing?: No Does the patient have difficulty seeing, even when wearing glasses/contacts?: No Does the patient have difficulty concentrating, remembering, or making decisions?: No Does the patient have difficulty walking or climbing stairs?: No Does the patient have difficulty dressing or bathing?: No Does the patient have difficulty doing errands alone such as visiting a doctor's office or shopping?: No   Assessment & Plan  1. Well woman exam  Discussed importance of 150 minutes of physical activity weekly, eat two servings of fish weekly, eat one serving of tree nuts ( cashews, pistachios, pecans, almonds.Marland Kitchen) every other day, eat 6 servings of fruit/vegetables daily and drink plenty of water and avoid sweet beverages.   2. Pre-diabetes  Discussed life style modification   3. Cervical cancer screening  -pap smear with hpv  4. Family history of breast cancer  - MM Digital Screening; Future  5. Breast cancer screening  - MM Digital Screening; Future   -USPSTF grade A and B recommendations reviewed with patient; age-appropriate recommendations, preventive care, screening tests, etc discussed and encouraged; healthy living encouraged; see AVS for patient education given to patient -Discussed importance of 150 minutes of physical activity weekly, eat two servings of fish weekly, eat one serving of tree nuts ( cashews, pistachios, pecans, almonds.Marland Kitchen) every other day, eat 6 servings of fruit/vegetables daily and drink plenty of water and avoid sweet beverages.  -Red flags and when to present for emergency care or RTC  including fever >101.30F, chest pain, shortness of breath, new/worsening/un-resolving symptoms, reviewed with patient at time of visit. Follow up and care instructions discussed and provided in AVS. -Reviewed Health Maintenance:

## 2017-06-09 LAB — RPR: RPR: NONREACTIVE

## 2017-06-09 LAB — HEPATITIS PANEL, ACUTE
HEP B C IGM: NONREACTIVE
HEP C AB: NONREACTIVE
Hep A IgM: NONREACTIVE
Hepatitis B Surface Ag: NONREACTIVE
SIGNAL TO CUT-OFF: 0.02 (ref <1.00)

## 2017-06-09 LAB — HIV ANTIBODY (ROUTINE TESTING W REFLEX): HIV: NONREACTIVE

## 2017-06-13 LAB — PAP IG, CT-NG NAA, HPV HIGH-RISK
C. trachomatis RNA, TMA: NOT DETECTED
HPV DNA HIGH RISK: NOT DETECTED
N. GONORRHOEAE RNA, TMA: NOT DETECTED

## 2017-11-30 ENCOUNTER — Encounter: Payer: Self-pay | Admitting: Family Medicine

## 2017-11-30 ENCOUNTER — Ambulatory Visit: Payer: BC Managed Care – PPO | Admitting: Nurse Practitioner

## 2017-11-30 ENCOUNTER — Ambulatory Visit: Payer: BC Managed Care – PPO | Admitting: Family Medicine

## 2017-11-30 ENCOUNTER — Ambulatory Visit (INDEPENDENT_AMBULATORY_CARE_PROVIDER_SITE_OTHER): Payer: BC Managed Care – PPO | Admitting: Family Medicine

## 2017-11-30 VITALS — BP 140/110 | HR 87 | Temp 98.4°F | Resp 16 | Ht 70.0 in | Wt 325.0 lb

## 2017-11-30 DIAGNOSIS — R030 Elevated blood-pressure reading, without diagnosis of hypertension: Secondary | ICD-10-CM

## 2017-11-30 DIAGNOSIS — H938X1 Other specified disorders of right ear: Secondary | ICD-10-CM | POA: Diagnosis not present

## 2017-11-30 MED ORDER — PREDNISONE 10 MG PO TABS: 10.0000 mg | ORAL_TABLET | Freq: Every day | ORAL | 0 refills | Status: DC

## 2017-11-30 MED ORDER — CIPROFLOXACIN-HYDROCORTISONE 0.2-1 % OT SUSP: 3.0000 [drp] | Freq: Two times a day (BID) | OTIC | 0 refills | Status: DC

## 2017-11-30 MED ORDER — AMOXICILLIN-POT CLAVULANATE 875-125 MG PO TABS: 1.0000 | ORAL_TABLET | Freq: Two times a day (BID) | ORAL | 0 refills | Status: DC

## 2017-11-30 NOTE — Progress Notes (Signed)
Name: Bridget Weiss   MRN: [id number]    DOB: 02-24-1976   Date:11/30/2017       Progress Note  Subjective  Chief Complaint  Chief Complaint  Patient presents with  . Otalgia    Onset-1 week started on left side-unable to ear-pressure. Now it is her right ear-constant pressure, swollen, painful and hard to ear. Has been having chewing and sharp pain intermittently with certain movements.     HPI  Ear pain: she states she noticed mild left ear pain last week that lasted a couple of days and resolved by itself, over the past week she noticed pain and also fullness on right ear that has been getting progressively worse. Ear lobe started to swell , she states she needs to pull down ear lobe to be able to hear better. No fever or chills, normal appetite.   Elevated bp: no history of hypertension, she states she did not sleep well last night. No chest pain or palpitation  Obesity: she is not trying to lose weight, explained importance of healthy diet and exercise.    Patient Active Problem List   Diagnosis Date Noted  . Elevated fasting blood sugar 07/10/2015  . Family history of breast cancer 07/10/2015  . Family history of diabetes mellitus 07/10/2015  . Morbid obesity (HCC) 07/10/2015  . Vitamin D deficiency 07/10/2015    Past Surgical History:  Procedure Laterality Date  . abscess on buttock     had to be drained at the OR  . CESAREAN SECTION  06/03/2011  . CHOLECYSTECTOMY  08/26/2011    Family History  Problem Relation Age of Onset  . Cancer Mother        Breast-Diagnosed in 2011  . Diabetes Mother   . Diabetes Maternal Aunt   . Diabetes Maternal Uncle     Social History   Socioeconomic History  . Marital status: Married    Spouse name: Antonio   . Number of children: 2  . Years of education: Not on file  . Highest education level: Not on file  Occupational History  . Occupation: Fish farm manager: [employer]  Social Needs  . Financial resource  strain: Not on file  . Food insecurity:    Worry: Not on file    Inability: Not on file  . Transportation needs:    Medical: Not on file    Non-medical: Not on file  Tobacco Use  . Smoking status: Never Smoker  . Smokeless tobacco: Never Used  Substance and Sexual Activity  . Alcohol use: Yes    Alcohol/week: 0.0 oz    Comment: occassionally  . Drug use: No  . Sexual activity: Yes    Partners: Female    Birth control/protection: Surgical    Comment: Tubal Ligation  Lifestyle  . Physical activity:    Days per week: Not on file    Minutes per session: Not on file  . Stress: Not on file  Relationships  . Social connections:    Talks on phone: Not on file    Gets together: Not on file    Attends religious service: Not on file    Active member of club or organization: Not on file    Attends meetings of clubs or organizations: Not on file    Relationship status: Not on file  . Intimate partner violence:    Fear of current or ex partner: Not on file    Emotionally abused: Not on file  Physically abused: Not on file    Forced sexual activity: Not on file  Other Topics Concern  . Not on file  Social History Narrative   Re-married, her husband has two children from a previous relationship   They have two children together   Works at [employer]     Current Outpatient Medications:  .  amoxicillin-clavulanate (AUGMENTIN) 875-125 MG tablet, Take 1 tablet by mouth 2 (two) times daily., Disp: 20 tablet, Rfl: 0 .  Cholecalciferol (VITAMIN D) 2000 units CAPS, Take 1 capsule (2,000 Units total) by mouth daily. (Patient not taking: Reported on 11/30/2017), Disp: 30 capsule, Rfl: 0 .  ciprofloxacin-hydrocortisone (CIPRO HC) OTIC suspension, Place 3 drops into the right ear 2 (two) times daily., Disp: 10 mL, Rfl: 0 .  predniSONE (DELTASONE) 10 MG tablet, Take 1 tablet (10 mg total) by mouth daily with breakfast., Disp: 10 tablet, Rfl: 0  Allergies  Allergen Reactions  .  Sulfamethoxazole-Trimethoprim Other (See Comments) and Nausea And Vomiting     ROS  Ten systems reviewed and is negative except as mentioned in HPI   Objective  Vitals:   11/30/17 0858  BP: (!) 144/88  Pulse: 87  Resp: 16  Temp: 98.4 F (36.9 C)  TempSrc: Oral  SpO2: 96%  Weight: (!) 325 lb (147.4 kg)  Height: 5\' 10"  (1.778 m)    Body mass index is 46.63 kg/m.  Physical Exam  Constitutional: Patient appears well-developed and well-nourished. Obese  No distress.  HEENT: head atraumatic, normocephalic, pupils equal and reactive to light, ears left ear canal has some debris, but normal TM no redness, right side, ear lobe is swollen, no tenderness on ear lobe, has swelling on the right anterior ear area, ear canal is swollen shut and tender with some redness, neck supple, throat within normal limits Cardiovascular: Normal rate, regular rhythm and normal heart sounds.  No murmur heard. No BLE edema. Pulmonary/Chest: Effort normal and breath sounds normal. No respiratory distress. Abdominal: Soft.  There is no tenderness. Psychiatric: Patient has a normal mood and affect. behavior is normal. Judgment and thought content normal.  PHQ2/9: Depression screen [facility] 2/9 11/30/2017 06/08/2017 04/29/2017 07/10/2015  Decreased Interest 0 0 0 0  Down, Depressed, Hopeless 0 0 0 0  PHQ - 2 Score 0 0 0 0  Altered sleeping - 0 - -  Tired, decreased energy - 0 - -  Change in appetite - 0 - -  Feeling bad or failure about yourself  - 0 - -  Trouble concentrating - 0 - -  Moving slowly or fidgety/restless - 0 - -  Suicidal thoughts - 0 - -  PHQ-9 Score - 0 - -  Difficult doing work/chores - Not difficult at all - -     Fall Risk: Fall Risk  11/30/2017 04/29/2017 07/10/2015  Falls in the past year? No No No    Functional Status Survey: Is the patient deaf or have difficulty hearing?: No Does the patient have difficulty seeing, even when wearing glasses/contacts?: No Does the patient have  difficulty concentrating, remembering, or making decisions?: No Does the patient have difficulty walking or climbing stairs?: No Does the patient have difficulty dressing or bathing?: No Does the patient have difficulty doing errands alone such as visiting a doctor's office or shopping?: No   Assessment & Plan  1. Swelling of right ear  - amoxicillin-clavulanate (AUGMENTIN) 875-125 MG tablet; Take 1 tablet by mouth 2 (two) times daily.  Dispense: 20 tablet; Refill: 0 -  predniSONE (DELTASONE) 10 MG tablet; Take 1 tablet (10 mg total) by mouth daily with breakfast.  Dispense: 10 tablet; Refill: 0 - ciprofloxacin-hydrocortisone (CIPRO HC) OTIC suspension; Place 3 drops into the right ear 2 (two) times daily.  Dispense: 10 mL; Refill: 0  Call back if no improvement for referral to ENT  2. Morbid obesity (HCC)  Discussed with the patient the risk posed by an increased BMI. Discussed importance of portion control, calorie counting and at least 150 minutes of physical activity weekly. Avoid sweet beverages and drink more water. Eat at least 6 servings of fruit and vegetables daily   3. Elevated blood pressure reading  Monitor bp at home, and pharmacy, may also stop by for bp check with CMA, if no improvement return sooner to start medication

## 2017-12-09 ENCOUNTER — Ambulatory Visit: Payer: BC Managed Care – PPO | Admitting: Family Medicine

## 2018-07-11 ENCOUNTER — Encounter: Payer: Self-pay | Admitting: Family Medicine

## 2018-09-12 ENCOUNTER — Other Ambulatory Visit (HOSPITAL_COMMUNITY)
Admission: RE | Admit: 2018-09-12 | Discharge: 2018-09-12 | Disposition: A | Discharge status: Home / Self Care | Payer: BC Managed Care – PPO | Source: Ambulatory Visit | Attending: Family Medicine | Admitting: Family Medicine

## 2018-09-12 ENCOUNTER — Ambulatory Visit (INDEPENDENT_AMBULATORY_CARE_PROVIDER_SITE_OTHER): Payer: BC Managed Care – PPO | Admitting: Family Medicine

## 2018-09-12 ENCOUNTER — Encounter: Payer: Self-pay | Admitting: Family Medicine

## 2018-09-12 ENCOUNTER — Encounter

## 2018-09-12 VITALS — BP 138/86 | HR 100 | Temp 98.7°F | Resp 16 | Ht 70.0 in | Wt 331.1 lb

## 2018-09-12 DIAGNOSIS — R739 Hyperglycemia, unspecified: Secondary | ICD-10-CM | POA: Diagnosis not present

## 2018-09-12 DIAGNOSIS — Z124 Encounter for screening for malignant neoplasm of cervix: Secondary | ICD-10-CM

## 2018-09-12 DIAGNOSIS — Z1239 Encounter for other screening for malignant neoplasm of breast: Secondary | ICD-10-CM | POA: Diagnosis not present

## 2018-09-12 DIAGNOSIS — Z113 Encounter for screening for infections with a predominantly sexual mode of transmission: Secondary | ICD-10-CM

## 2018-09-12 DIAGNOSIS — Z01419 Encounter for gynecological examination (general) (routine) without abnormal findings: Secondary | ICD-10-CM | POA: Diagnosis not present

## 2018-09-12 DIAGNOSIS — Z13220 Encounter for screening for lipoid disorders: Secondary | ICD-10-CM

## 2018-09-12 NOTE — Patient Instructions (Signed)
Preventive Care 18-39 Years, Female Preventive care refers to lifestyle choices and visits with your health care provider that can promote health and wellness. What does preventive care include?   A yearly physical exam. This is also called an annual well check.  Dental exams once or twice a year.  Routine eye exams. Ask your health care provider how often you should have your eyes checked.  Personal lifestyle choices, including: ? Daily care of your teeth and gums. ? Regular physical activity. ? Eating a healthy diet. ? Avoiding tobacco and drug use. ? Limiting alcohol use. ? Practicing safe sex. ? Taking vitamin and mineral supplements as recommended by your health care provider. What happens during an annual well check? The services and screenings done by your health care provider during your annual well check will depend on your age, overall health, lifestyle risk factors, and family history of disease. Counseling Your health care provider may ask you questions about your:  Alcohol use.  Tobacco use.  Drug use.  Emotional well-being.  Home and relationship well-being.  Sexual activity.  Eating habits.  Work and work environment.  Method of birth control.  Menstrual cycle.  Pregnancy history. Screening You may have the following tests or measurements:  Height, weight, and BMI.  Diabetes screening. This is done by checking your blood sugar (glucose) after you have not eaten for a while (fasting).  Blood pressure.  Lipid and cholesterol levels. These may be checked every 5 years starting at age 20.  Skin check.  Hepatitis C blood test.  Hepatitis B blood test.  Sexually transmitted disease (STD) testing.  BRCA-related cancer screening. This may be done if you have a family history of breast, ovarian, tubal, or peritoneal cancers.  Pelvic exam and Pap test. This may be done every 3 years starting at age 21. Starting at age 30, this may be done every 5  years if you have a Pap test in combination with an HPV test. Discuss your test results, treatment options, and if necessary, the need for more tests with your health care provider. Vaccines Your health care provider may recommend certain vaccines, such as:  Influenza vaccine. This is recommended every year.  Tetanus, diphtheria, and acellular pertussis (Tdap, Td) vaccine. You may need a Td booster every 10 years.  Varicella vaccine. You may need this if you have not been vaccinated.  HPV vaccine. If you are 26 or younger, you may need three doses over 6 months.  Measles, mumps, and rubella (MMR) vaccine. You may need at least one dose of MMR. You may also need a second dose.  Pneumococcal 13-valent conjugate (PCV13) vaccine. You may need this if you have certain conditions and were not previously vaccinated.  Pneumococcal polysaccharide (PPSV23) vaccine. You may need one or two doses if you smoke cigarettes or if you have certain conditions.  Meningococcal vaccine. One dose is recommended if you are age 19-21 years and a first-year college student living in a residence hall, or if you have one of several medical conditions. You may also need additional booster doses.  Hepatitis A vaccine. You may need this if you have certain conditions or if you travel or work in places where you may be exposed to hepatitis A.  Hepatitis B vaccine. You may need this if you have certain conditions or if you travel or work in places where you may be exposed to hepatitis B.  Haemophilus influenzae type b (Hib) vaccine. You may need this if you   have certain risk factors. Talk to your health care provider about which screenings and vaccines you need and how often you need them. This information is not intended to replace advice given to you by your health care provider. Make sure you discuss any questions you have with your health care provider. Document Released: 10/19/2001 Document Revised: 04/05/2017  Document Reviewed: 06/24/2015 Elsevier Interactive Patient Education  2019 [employer].

## 2018-09-12 NOTE — Progress Notes (Signed)
Name: Bridget Weiss   MRN: [id number]    DOB: December 15, 1975   Date:09/12/2018       Progress Note  Subjective  Chief Complaint  Chief Complaint  Patient presents with  . Annual Exam    HPI   Patient presents for annual CPE and follow up  Diet: she keeps gaining weight. She still eats fast food, she skips breakfast at time. Discussed packing lunch, not skipping breakfast, adding more fruit and vegetables.  Exercise: not physically active   Pre-diabetes and morbid obesity: discussed options, she will try changing her diet and will return for follow up in March 2020    Office Visit from 09/12/2018 in [facility]  AUDIT-C Score  0     Depression:  Depression screen [facility] 2/9 09/12/2018 11/30/2017 06/08/2017 04/29/2017 07/10/2015  Decreased Interest 0 0 0 0 0  Down, Depressed, Hopeless 0 0 0 0 0  PHQ - 2 Score 0 0 0 0 0  Altered sleeping - - 0 - -  Tired, decreased energy - - 0 - -  Change in appetite - - 0 - -  Feeling bad or failure about yourself  - - 0 - -  Trouble concentrating - - 0 - -  Moving slowly or fidgety/restless - - 0 - -  Suicidal thoughts - - 0 - -  PHQ-9 Score - - 0 - -  Difficult doing work/chores - - Not difficult at all - -   Hypertension: BP Readings from Last 3 Encounters:  09/12/18 138/86  11/30/17 (!) 140/110  06/08/17 100/60   Obesity: Wt Readings from Last 3 Encounters:  09/12/18 (!) 331 lb 1.6 oz (150.2 kg)  11/30/17 (!) 325 lb (147.4 kg)  06/08/17 (!) 316 lb 11.2 oz (143.7 kg)   BMI Readings from Last 3 Encounters:  09/12/18 47.51 kg/m  11/30/17 46.63 kg/m  06/08/17 45.44 kg/m    Hep C Screening: today  STD testing and prevention (HIV/chl/gon/syphilis): today  Intimate partner violence: negative screen  Sexual History/Pain during Intercourse: no pain during intercourse Menstrual History/LMP/Abnormal Bleeding: Incontinence Symptoms:   Advanced Care Planning: A voluntary discussion about advance care planning including the  explanation and discussion of advance directives.  Discussed health care proxy and Living will, and the patient was able to identify a health care proxy as husband  Patient does not have a living will at present time.   Breast cancer: scheduled for [employer]  BRCA gene screening: mother had breast cancer - not sure if mother was genetically tested , she will contact the oncologist office, nobody else  Cervical cancer screening: discussed USPTF but she still wants to have pap done today    Lipids:  Lab Results  Component Value Date   CHOL 158 06/03/2017   CHOL 144 12/16/2014   Lab Results  Component Value Date   HDL 53 06/03/2017   HDL 63 12/16/2014   Lab Results  Component Value Date   LDLCALC 91 06/03/2017   LDLCALC 69 12/16/2014   Lab Results  Component Value Date   TRIG 54 06/03/2017   TRIG 61 12/16/2014   Lab Results  Component Value Date   CHOLHDL 3.0 06/03/2017   No results found for: LDLDIRECT  Glucose:  Glucose  Date Value Ref Range Status  02/06/2012 117 (H) 65 - 99 mg/dL Final  01/29/2012 76 65 - 99 mg/dL Final   Glucose, Bld  Date Value Ref Range Status  04/27/2017 109 (H) 65 - 99 mg/dL  Final    Skin cancer: discussed atypical lesions, she is worried about a skin tag on vulva area Colorectal cancer: discussed starting screen at age 65 since she is AA ECG: hold off for now   Patient Active Problem List   Diagnosis Date Noted  . Elevated fasting blood sugar 07/10/2015  . Family history of breast cancer 07/10/2015  . Family history of diabetes mellitus 07/10/2015  . Morbid obesity ([place]) 07/10/2015  . Vitamin D deficiency 07/10/2015    Past Surgical History:  Procedure Laterality Date  . abscess on buttock     had to be drained at the OR  . CESAREAN SECTION  06/03/2011  . CESAREAN SECTION  2014  . CHOLECYSTECTOMY  08/26/2011    Family History  Problem Relation Age of Onset  . Diabetes Mother   . Breast cancer Mother        Breast-Diagnosed in  2011 Stage 4  . Diabetes Maternal Aunt   . Diabetes Maternal Uncle   . Cancer Maternal Grandmother   . Heart failure Paternal Grandmother   . Stroke Paternal Grandfather     Social History   Socioeconomic History  . Marital status: Married    Spouse name: Antonio   . Number of children: 2  . Years of education: Not on file  . Highest education level: Not on file  Occupational History  . Occupation: Therapist, occupational: [employer]  Social Needs  . Financial resource strain: Not hard at all  . Food insecurity:    Worry: Never true    Inability: Never true  . Transportation needs:    Medical: No    Non-medical: No  Tobacco Use  . Smoking status: Never Smoker  . Smokeless tobacco: Never Used  Substance and Sexual Activity  . Alcohol use: Yes    Alcohol/week: 0.0 standard drinks    Comment: occassionally  . Drug use: No  . Sexual activity: Yes    Partners: Male    Birth control/protection: Surgical    Comment: Tubal Ligation  Lifestyle  . Physical activity:    Days per week: 0 days    Minutes per session: 0 min  . Stress: Not at all  Relationships  . Social connections:    Talks on phone: More than three times a week    Gets together: More than three times a week    Attends religious service: More than 4 times per year    Active member of club or organization: Yes    Attends meetings of clubs or organizations: More than 4 times per year    Relationship status: Married  . Intimate partner violence:    Fear of current or ex partner: No    Emotionally abused: No    Physically abused: No    Forced sexual activity: No  Other Topics Concern  . Not on file  Social History Narrative   Re-married, her husband has two children from a previous relationship   They have two children together   Works at [employer]    No current outpatient medications on file.  Allergies  Allergen Reactions  . Sulfamethoxazole-Trimethoprim Nausea And Vomiting and Other (See  Comments)    Headache      ROS  Constitutional: Negative for fever or weight change.  Respiratory: Negative for cough and shortness of breath.   Cardiovascular: Negative for chest pain , intermittent palpitations - lasts a few seconds and about once every 8 months. Discussed red flags and  when to go to [facility] .  Gastrointestinal: Negative for abdominal pain, no bowel changes.  Musculoskeletal: Negative for gait problem or joint swelling.  Skin: Negative for rash.  Neurological: Negative for dizziness or headache.  No other specific complaints in a complete review of systems (except as listed in HPI above).  Objective  Vitals:   09/12/18 1030  BP: 138/86  Pulse: 100  Resp: 16  Temp: 98.7 F (37.1 C)  TempSrc: Oral  SpO2: 98%  Weight: (!) 331 lb 1.6 oz (150.2 kg)  Height: '5\' 10"'  (1.778 m)    Body mass index is 47.51 kg/m.  Physical Exam  Constitutional: Patient appears well-developed and well-nourished. Obese  No distress.  HENT: Head: Normocephalic and atraumatic. Ears: B TMs ok, no erythema or effusion; Nose: Nose normal. Mouth/Throat: Oropharynx is clear and moist. No oropharyngeal exudate.  Eyes: Conjunctivae and EOM are normal. Pupils are equal, round, and reactive to light. No scleral icterus.  Neck: Normal range of motion. Neck supple. No JVD present. No thyromegaly present.  Cardiovascular: Normal rate, regular rhythm and normal heart sounds.  No murmur heard. No BLE edema. Pulmonary/Chest: Effort normal and breath sounds normal. No respiratory distress. Abdominal: Soft. Bowel sounds are normal, no distension. There is no tenderness. no masses Breast: no lumps or masses, no nipple discharge or rashes FEMALE GENITALIA:  External genitalia normal External urethra normal Vaginal vault normal without discharge or lesions Cervix normal without discharge or lesions Bimanual exam normal without masses RECTAL: not done  Musculoskeletal: Normal range of motion, no joint  effusions. No gross deformities Neurological: he is alert and oriented to person, place, and time. No cranial nerve deficit. Coordination, balance, strength, speech and gait are normal.  Skin: Skin is warm and dry. No rash noted. No erythema. she has a hole on right toe nail, likely from trauma, she noticed after nails were done but growing now. She has hypertrichosis of face Psychiatric: Patient has a normal mood and affect. behavior is normal. Judgment and thought content normal.  PHQ2/9: Depression screen [facility] 2/9 09/12/2018 11/30/2017 06/08/2017 04/29/2017 07/10/2015  Decreased Interest 0 0 0 0 0  Down, Depressed, Hopeless 0 0 0 0 0  PHQ - 2 Score 0 0 0 0 0  Altered sleeping - - 0 - -  Tired, decreased energy - - 0 - -  Change in appetite - - 0 - -  Feeling bad or failure about yourself  - - 0 - -  Trouble concentrating - - 0 - -  Moving slowly or fidgety/restless - - 0 - -  Suicidal thoughts - - 0 - -  PHQ-9 Score - - 0 - -  Difficult doing work/chores - - Not difficult at all - -     Fall Risk: Fall Risk  09/12/2018 11/30/2017 04/29/2017 07/10/2015  Falls in the past year? 0 No No No  Number falls in past yr: 0 - - -  Injury with Fall? 0 - - -     Functional Status Survey: Is the patient deaf or have difficulty hearing?: No Does the patient have difficulty seeing, even when wearing glasses/contacts?: No Does the patient have difficulty concentrating, remembering, or making decisions?: No Does the patient have difficulty walking or climbing stairs?: No Does the patient have difficulty dressing or bathing?: No Does the patient have difficulty doing errands alone such as visiting a doctor's office or shopping?: No   Assessment & Plan  1. Breast cancer screening  - MM 3D  SCREEN BREAST BILATERAL  2. Well woman exam  - COMPLETE METABOLIC PANEL WITH GFR  3. Cervical cancer screening  - Cytology - PAP  4. Hyperglycemia  - Hemoglobin A1c  5. Lipid screening  - Lipid  panel  6. Routine screening for STI (sexually transmitted infection)  - HIV Antibody (routine testing w rflx) - RPR - Hepatitis panel, acute   -USPSTF grade A and B recommendations reviewed with patient; age-appropriate recommendations, preventive care, screening tests, etc discussed and encouraged; healthy living encouraged; see AVS for patient education given to patient -Discussed importance of 150 minutes of physical activity weekly, eat two servings of fish weekly, eat one serving of tree nuts ( cashews, pistachios, pecans, almonds.Marland Kitchen) every other day, eat 6 servings of fruit/vegetables daily and drink plenty of water and avoid sweet beverages.

## 2018-09-13 LAB — HIV ANTIBODY (ROUTINE TESTING W REFLEX): HIV: NONREACTIVE

## 2018-09-13 LAB — COMPLETE METABOLIC PANEL WITH GFR
AG RATIO: 1.2 (calc) (ref 1.0–2.5)
ALBUMIN MSPROF: 4 g/dL (ref 3.6–5.1)
ALT: 16 U/L (ref 6–29)
AST: 13 U/L (ref 10–30)
Alkaline phosphatase (APISO): 86 U/L (ref 33–115)
BILIRUBIN TOTAL: 0.8 mg/dL (ref 0.2–1.2)
BUN: 9 mg/dL (ref 7–25)
CHLORIDE: 104 mmol/L (ref 98–110)
CO2: 23 mmol/L (ref 20–32)
Calcium: 9.2 mg/dL (ref 8.6–10.2)
Creat: 0.59 mg/dL (ref 0.50–1.10)
GFR, EST AFRICAN AMERICAN: 133 mL/min/{1.73_m2} (ref >=60)
GFR, Est Non African American: 115 mL/min/{1.73_m2} (ref >=60)
Globulin: 3.3 g/dL (calc) (ref 1.9–3.7)
Glucose, Bld: 118 mg/dL (ref 65–139)
POTASSIUM: 4 mmol/L (ref 3.5–5.3)
Sodium: 138 mmol/L (ref 135–146)
Total Protein: 7.3 g/dL (ref 6.1–8.1)

## 2018-09-13 LAB — HEMOGLOBIN A1C
Hgb A1c MFr Bld: 6.6 % of total Hgb — ABNORMAL HIGH (ref <5.7)
Mean Plasma Glucose: 143 (calc)
eAG (mmol/L): 7.9 (calc)

## 2018-09-13 LAB — HEPATITIS PANEL, ACUTE
HEP A IGM: NONREACTIVE
HEP B C IGM: NONREACTIVE
HEP B S AG: NONREACTIVE
Hepatitis C Ab: NONREACTIVE
SIGNAL TO CUT-OFF: 0.02 (ref <1.00)

## 2018-09-13 LAB — RPR: RPR Ser Ql: NONREACTIVE

## 2018-09-13 LAB — LIPID PANEL
Cholesterol: 158 mg/dL (ref <200)
HDL: 48 mg/dL — AB (ref >50)
LDL Cholesterol (Calc): 92 mg/dL (calc)
Non-HDL Cholesterol (Calc): 110 mg/dL (calc) (ref <130)
TRIGLYCERIDES: 88 mg/dL (ref <150)
Total CHOL/HDL Ratio: 3.3 (calc) (ref <5.0)

## 2018-09-14 LAB — CYTOLOGY - PAP
CHLAMYDIA, DNA PROBE: NEGATIVE
Diagnosis: NEGATIVE
HPV: NOT DETECTED
Neisseria Gonorrhea: NEGATIVE

## 2018-11-15 ENCOUNTER — Ambulatory Visit: Payer: BC Managed Care – PPO | Admitting: Family Medicine

## 2018-12-04 ENCOUNTER — Ambulatory Visit: Payer: BC Managed Care – PPO | Admitting: Family Medicine

## 2019-01-24 ENCOUNTER — Ambulatory Visit: Payer: BC Managed Care – PPO | Admitting: Family Medicine

## 2019-04-25 ENCOUNTER — Ambulatory Visit: Payer: BC Managed Care – PPO | Admitting: Family Medicine

## 2019-06-05 ENCOUNTER — Ambulatory Visit: Payer: BC Managed Care – PPO | Admitting: Family Medicine

## 2019-08-14 ENCOUNTER — Ambulatory Visit: Payer: BC Managed Care – PPO | Admitting: Family Medicine

## 2019-10-11 ENCOUNTER — Ambulatory Visit: Payer: Self-pay | Admitting: *Deleted

## 2019-10-11 NOTE — Telephone Encounter (Signed)
Pt called stating her BP was 150/105 on 1000 10/11/19 on left upper arm; it was 150s/100 on right upper arm on 10/10/19; she says that she normally SBP 130s; she denies headache, chest pain, or difficulty breathing; recommendations made per nurse triage protocol; she verbalized understanding, and would like to see Dr Carlynn Purl only; she also states that she is off on 2/8 and 2/9; algorithm completed; pt offered and accepted virtual visit with Dr Carlynn Purl 10/15/19 at 0940; spoke with Bjorn Loser; will route to office for notification. Reason for Disposition . Systolic BP  >= 160 OR Diastolic >= 100  Answer Assessment - Initial Assessment Questions 1. BLOOD PRESSURE: "What is the blood pressure?" "Did you take at least two measurements 5 minutes apart?"     150/105 and 150/100 2. ONSET: "When did you take your blood pressure?"     10/11/19 at 1000 on left upper arm and 10/10/19 on right upper arm 3. HOW: "How did you obtain the blood pressure?" (e.g., visiting nurse, automatic home BP monitor)  home cuff  4. HISTORY: "Do you have a history of high blood pressure?"     no 5. MEDICATIONS: "Are you taking any medications for blood pressure?" "Have you missed any doses recently?"     no 6. OTHER SYMPTOMS: "Do you have any symptoms?" (e.g., headache, chest pain, blurred vision, difficulty breathing, weakness)    no 7. PREGNANCY: "Is there any chance you are pregnant?" "When was your last menstrual period?"  no BTL; LMP 10/03/19  Protocols used: HIGH BLOOD PRESSURE-A-AH

## 2019-10-15 ENCOUNTER — Other Ambulatory Visit: Payer: Self-pay

## 2019-10-15 ENCOUNTER — Telehealth (INDEPENDENT_AMBULATORY_CARE_PROVIDER_SITE_OTHER): Payer: BC Managed Care – PPO | Admitting: Family Medicine

## 2019-10-15 ENCOUNTER — Encounter: Payer: Self-pay | Admitting: Family Medicine

## 2019-10-15 VITALS — BP 170/90 | HR 83 | Ht 70.0 in | Wt 321.0 lb

## 2019-10-15 DIAGNOSIS — E559 Vitamin D deficiency, unspecified: Secondary | ICD-10-CM

## 2019-10-15 DIAGNOSIS — R739 Hyperglycemia, unspecified: Secondary | ICD-10-CM

## 2019-10-15 DIAGNOSIS — I10 Essential (primary) hypertension: Secondary | ICD-10-CM

## 2019-10-15 DIAGNOSIS — Z13220 Encounter for screening for lipoid disorders: Secondary | ICD-10-CM

## 2019-10-15 MED ORDER — HYDROCHLOROTHIAZIDE 12.5 MG PO TABS: 12.5000 mg | ORAL_TABLET | Freq: Every day | ORAL | 0 refills | Status: DC

## 2019-10-15 NOTE — Progress Notes (Signed)
Name: Bridget Weiss   MRN: [id number]    DOB: [birth year]   Date:10/15/2019       Progress Note  Subjective  Chief Complaint  Chief Complaint  Patient presents with  . Hypertension    Last week was 150/105 on Thursday and Friday  . Follow-up    I connected with  [name]  on 10/15/19 at  9:40 AM EST by a video enabled telemedicine application and verified that I am speaking with the correct person using two identifiers.  I discussed the limitations of evaluation and management by telemedicine and the availability of in person appointments. The patient expressed understanding and agreed to proceed. Staff also discussed with the patient that there may be a patient responsible charge related to this service. Patient Location: at home  Provider Location: [facility]    HPI  Pre-diabetes and morbid obesity: discussed options, A1C was up in the past. No polyphagia, polyuria or polydipsia   Uncontrolled HTN: patient is monitoring her bp at home and has been elevated for the past week . She states she only checked because her husband was checking his bp and she felt like she should check hers. also.  Morbid Obesity: she lost 10 lbs since last visit. She has cutting down on the frequency that she eats and also stops eating after 6: 30 pm. She is also taking multivitamins on a regular basis   Patient Active Problem List   Diagnosis Date Noted  . Elevated fasting blood sugar 07/10/2015  . Family history of breast cancer 07/10/2015  . Family history of diabetes mellitus 07/10/2015  . Morbid obesity ([place]) 07/10/2015  . Vitamin D deficiency 07/10/2015    Past Surgical History:  Procedure Laterality Date  . abscess on buttock     had to be drained at the OR  . CESAREAN SECTION  06/03/2011  . CESAREAN SECTION  2014  . CHOLECYSTECTOMY  08/26/2011    Family History  Problem Relation Age of Onset  . Diabetes Mother   . Breast cancer Mother        Breast-Diagnosed in  2011 Stage 4  . Diabetes Maternal Aunt   . Diabetes Maternal Uncle   . Cancer Maternal Grandmother   . Heart failure Paternal Grandmother   . Stroke Paternal Grandfather      Current Outpatient Medications:  .  Cyanocobalamin (B-12) 1000 MCG CAPS, Take 1 tablet by mouth daily., Disp: , Rfl:  .  VITAMIN D PO, Take 50 mcg by mouth daily., Disp: , Rfl:  .  hydrochlorothiazide (HYDRODIURIL) 12.5 MG tablet, Take 1 tablet (12.5 mg total) by mouth daily., Disp: 30 tablet, Rfl: 0  Allergies  Allergen Reactions  . Sulfamethoxazole-Trimethoprim Nausea And Vomiting and Other (See Comments)    Headache, Dizziness, Lightlessness, Sweating, Nausea      I personally reviewed active problem list, medication list, allergies, family history, social history with the patient/caregiver today.   ROS  Ten systems reviewed and is negative except as mentioned in HPI   Objective  Virtual encounter, vitals obtained at home  Today's Vitals   10/15/19 1016 10/15/19 1128  BP: (!) 170/113 (!) 170/90  Pulse: 83   Weight: (!) 321 lb (145.6 kg)   Height: 5\' 10"  (1.778 m)    Body mass index is 46.06 kg/m.  Physical Exam  Awake, alert and oriented  PHQ2/9: Depression screen [facility] 2/9 10/15/2019 09/12/2018 11/30/2017 06/08/2017 04/29/2017  Decreased Interest 0 0 0 0 0  Down, Depressed, Hopeless 0 0 0 0 0  PHQ - 2 Score 0 0 0 0 0  Altered sleeping 0 - - 0 -  Tired, decreased energy 0 - - 0 -  Change in appetite 0 - - 0 -  Feeling bad or failure about yourself  0 - - 0 -  Trouble concentrating 0 - - 0 -  Moving slowly or fidgety/restless 0 - - 0 -  Suicidal thoughts 0 - - 0 -  PHQ-9 Score 0 - - 0 -  Difficult doing work/chores Not difficult at all - - Not difficult at all -   PHQ-2/9 Result is negative.    Fall Risk: Fall Risk  10/15/2019 09/12/2018 11/30/2017 04/29/2017 07/10/2015  Falls in the past year? 0 0 No No No  Number falls in past yr: 0 0 - - -  Injury with Fall? 0 0 - - -     Assessment &  Plan  1. Uncontrolled hypertension  - COMPLETE METABOLIC PANEL WITH GFR - CBC with Differential/Platelet - TSH - Microalbumin / creatinine urine ratio - hydrochlorothiazide (HYDRODIURIL) 12.5 MG tablet; Take 1 tablet (12.5 mg total) by mouth daily.  Dispense: 30 tablet; Refill: 0  2. Morbid obesity (HCC)  Discussed with the patient the risk posed by an increased BMI. Discussed importance of portion control, calorie counting and at least 150 minutes of physical activity weekly. Avoid sweet beverages and drink more water. Eat at least 6 servings of fruit and vegetables daily   3. Lipid screening  - Lipid panel  4. Hyperglycemia  - Hemoglobin A1c  5. Vitamin D deficiency  - VITAMIN D 25 Hydroxy (Vit-D Deficiency, Fractures)  The patient was advised to call back or seek an in-person evaluation if the symptoms worsen or if the condition fails to improve as anticipated.  I provided 25 minutes of non-face-to-face time during this encounter.

## 2019-10-16 ENCOUNTER — Encounter: Payer: BC Managed Care – PPO | Admitting: Family Medicine

## 2019-10-16 LAB — HEMOGLOBIN A1C
Hgb A1c MFr Bld: 6.5 % of total Hgb — ABNORMAL HIGH (ref <5.7)
Mean Plasma Glucose: 140 (calc)
eAG (mmol/L): 7.7 (calc)

## 2019-10-16 LAB — VITAMIN D 25 HYDROXY (VIT D DEFICIENCY, FRACTURES): Vit D, 25-Hydroxy: 19 ng/mL — ABNORMAL LOW (ref 30–100)

## 2019-10-16 LAB — CBC WITH DIFFERENTIAL/PLATELET
Absolute Monocytes: 367 cells/uL (ref 200–950)
Basophils Absolute: 43 cells/uL (ref 0–200)
Basophils Relative: 0.6 %
Eosinophils Absolute: 86 cells/uL (ref 15–500)
Eosinophils Relative: 1.2 %
HCT: 37.8 % (ref 35.0–45.0)
Hemoglobin: 12.9 g/dL (ref 11.7–15.5)
Lymphs Abs: 1548 cells/uL (ref 850–3900)
MCH: 28.1 pg (ref 27.0–33.0)
MCHC: 34.1 g/dL (ref 32.0–36.0)
MCV: 82.4 fL (ref 80.0–100.0)
MPV: 11 fL (ref 7.5–12.5)
Monocytes Relative: 5.1 %
Neutro Abs: 5155 cells/uL (ref 1500–7800)
Neutrophils Relative %: 71.6 %
Platelets: 231 10*3/uL (ref 140–400)
RBC: 4.59 10*6/uL (ref 3.80–5.10)
RDW: 13.7 % (ref 11.0–15.0)
Total Lymphocyte: 21.5 %
WBC: 7.2 10*3/uL (ref 3.8–10.8)

## 2019-10-16 LAB — COMPLETE METABOLIC PANEL WITH GFR
AG Ratio: 1.3 (calc) (ref 1.0–2.5)
ALT: 16 U/L (ref 6–29)
AST: 15 U/L (ref 10–30)
Albumin: 4 g/dL (ref 3.6–5.1)
Alkaline phosphatase (APISO): 73 U/L (ref 31–125)
BUN: 10 mg/dL (ref 7–25)
CO2: 28 mmol/L (ref 20–32)
Calcium: 9.2 mg/dL (ref 8.6–10.2)
Chloride: 103 mmol/L (ref 98–110)
Creat: 0.63 mg/dL (ref 0.50–1.10)
GFR, Est African American: 128 mL/min/{1.73_m2} (ref >=60)
GFR, Est Non African American: 111 mL/min/{1.73_m2} (ref >=60)
Globulin: 3.2 g/dL (calc) (ref 1.9–3.7)
Glucose, Bld: 149 mg/dL — ABNORMAL HIGH (ref 65–99)
Potassium: 4 mmol/L (ref 3.5–5.3)
Sodium: 138 mmol/L (ref 135–146)
Total Bilirubin: 0.8 mg/dL (ref 0.2–1.2)
Total Protein: 7.2 g/dL (ref 6.1–8.1)

## 2019-10-16 LAB — LIPID PANEL
Cholesterol: 148 mg/dL (ref <200)
HDL: 48 mg/dL — ABNORMAL LOW (ref >=50)
LDL Cholesterol (Calc): 78 mg/dL (calc)
Non-HDL Cholesterol (Calc): 100 mg/dL (calc) (ref <130)
Total CHOL/HDL Ratio: 3.1 (calc) (ref <5.0)
Triglycerides: 119 mg/dL (ref <150)

## 2019-10-16 LAB — TSH: TSH: 1.41 mIU/L

## 2019-10-16 LAB — MICROALBUMIN / CREATININE URINE RATIO
Creatinine, Urine: 73 mg/dL (ref 20–275)
Microalb Creat Ratio: 10 mcg/mg creat (ref <30)
Microalb, Ur: 0.7 mg/dL

## 2019-10-18 ENCOUNTER — Encounter: Payer: Self-pay | Admitting: Family Medicine

## 2019-10-18 ENCOUNTER — Other Ambulatory Visit: Payer: Self-pay | Admitting: Family Medicine

## 2019-10-18 MED ORDER — VITAMIN D (ERGOCALCIFEROL) 1.25 MG (50000 UNIT) PO CAPS: 50000.0000 [IU] | ORAL_CAPSULE | ORAL | 0 refills | Status: DC

## 2019-10-22 ENCOUNTER — Other Ambulatory Visit: Payer: Self-pay

## 2019-10-22 ENCOUNTER — Ambulatory Visit: Payer: BC Managed Care – PPO

## 2019-10-22 VITALS — BP 136/88 | HR 77 | Resp 20 | Ht 70.0 in | Wt 325.0 lb

## 2019-10-22 DIAGNOSIS — I10 Essential (primary) hypertension: Secondary | ICD-10-CM

## 2019-10-22 NOTE — Progress Notes (Signed)
Patient is here for a blood pressure check. Patient denies chest pain, palpitations, shortness of breath or visual disturbances.Today during nurse visit first check blood pressure was 136/88, heart rate was 77. She does take blood pressure medication.

## 2019-11-13 ENCOUNTER — Encounter: Payer: Self-pay | Admitting: Family Medicine

## 2019-11-13 ENCOUNTER — Other Ambulatory Visit: Payer: Self-pay

## 2019-11-13 ENCOUNTER — Other Ambulatory Visit: Payer: Self-pay | Admitting: Family Medicine

## 2019-11-13 ENCOUNTER — Ambulatory Visit (INDEPENDENT_AMBULATORY_CARE_PROVIDER_SITE_OTHER): Payer: BC Managed Care – PPO | Admitting: Family Medicine

## 2019-11-13 ENCOUNTER — Other Ambulatory Visit (HOSPITAL_COMMUNITY)
Admission: RE | Admit: 2019-11-13 | Discharge: 2019-11-13 | Disposition: A | Discharge status: Home / Self Care | Payer: BC Managed Care – PPO | Source: Ambulatory Visit | Attending: Family Medicine | Admitting: Family Medicine

## 2019-11-13 VITALS — BP 134/92 | HR 86 | Temp 96.9°F | Resp 16 | Ht 69.5 in | Wt 323.4 lb

## 2019-11-13 DIAGNOSIS — I10 Essential (primary) hypertension: Secondary | ICD-10-CM | POA: Diagnosis not present

## 2019-11-13 DIAGNOSIS — Z113 Encounter for screening for infections with a predominantly sexual mode of transmission: Secondary | ICD-10-CM | POA: Diagnosis not present

## 2019-11-13 DIAGNOSIS — Z0000 Encounter for general adult medical examination without abnormal findings: Secondary | ICD-10-CM | POA: Diagnosis not present

## 2019-11-13 DIAGNOSIS — Z124 Encounter for screening for malignant neoplasm of cervix: Secondary | ICD-10-CM | POA: Diagnosis not present

## 2019-11-13 DIAGNOSIS — Z1231 Encounter for screening mammogram for malignant neoplasm of breast: Secondary | ICD-10-CM

## 2019-11-13 DIAGNOSIS — Z23 Encounter for immunization: Secondary | ICD-10-CM

## 2019-11-13 DIAGNOSIS — E559 Vitamin D deficiency, unspecified: Secondary | ICD-10-CM

## 2019-11-13 MED ORDER — VALSARTAN-HYDROCHLOROTHIAZIDE 80-12.5 MG PO TABS: 1.0000 | ORAL_TABLET | Freq: Every day | ORAL | 0 refills | Status: DC

## 2019-11-13 NOTE — Patient Instructions (Signed)

## 2019-11-13 NOTE — Progress Notes (Signed)
Name: Bridget Weiss   MRN: [id number]    DOB: 1976/03/01   Date:11/13/2019       Progress Note  Subjective  Chief Complaint  Chief Complaint  Patient presents with  . Annual Exam    HPI  Patient presents for annual CPE.  HTN: we started her on HCTZ one month ago, she denies side effects of medication, no chest pain or palpitation. BP at home has been 130's-140's/80's-90's, discussed getting bp at goal, we will add diovan/hctz 80/12.5 mg daily and she will return for bp check and BMP in a few weeks.   Vitamin D deficiency: I sent rx vitamin D but she has not filled rx yet  Diet: she is followed DASH diet, portion control  Exercise: doing well now   USPSTF grade A and B recommendations    Office Visit from 11/13/2019 in [facility]  AUDIT-C Score  4     Depression: Phq 9 is  negative Depression screen [facility] 2/9 11/13/2019 10/15/2019 09/12/2018 11/30/2017 06/08/2017  Decreased Interest 0 0 0 0 0  Down, Depressed, Hopeless 0 0 0 0 0  PHQ - 2 Score 0 0 0 0 0  Altered sleeping 0 0 - - 0  Tired, decreased energy 0 0 - - 0  Change in appetite 0 0 - - 0  Feeling bad or failure about yourself  0 0 - - 0  Trouble concentrating 0 0 - - 0  Moving slowly or fidgety/restless 0 0 - - 0  Suicidal thoughts 0 0 - - 0  PHQ-9 Score 0 0 - - 0  Difficult doing work/chores - Not difficult at all - - Not difficult at all   Hypertension: BP Readings from Last 3 Encounters:  11/13/19 (!) 134/92  10/22/19 136/88  10/15/19 (!) 170/90   Obesity: Wt Readings from Last 3 Encounters:  11/13/19 (!) 323 lb 6.4 oz (146.7 kg)  10/22/19 (!) 325 lb (147.4 kg)  10/15/19 (!) 321 lb (145.6 kg)   BMI Readings from Last 3 Encounters:  11/13/19 47.07 kg/m  10/22/19 46.63 kg/m  10/15/19 46.06 kg/m     Hep C Screening: 09/2018  STD testing and prevention (HIV/chl/gon/syphilis): she wants to have it done  Intimate partner violence: negative screen  Sexual History  (Partners/Practices/Protection from [employer] hx STI/Pregnancy Plans): one partner, no history of STI Pain during Intercourse: no  Menstrual History/LMP/Abnormal Bleeding: regular cycles  Incontinence Symptoms: no problems   Breast cancer:  - Last Mammogram: she never had one, but will schedule it this time  - BRCA gene screening: mother died of breast cancer , also first cousin on father's side . She is not interested at this time  Osteoporosis: Discussed high calcium and vitamin D supplementation, weight bearing exercises  Cervical cancer screening: up to date   Skin cancer: Discussed monitoring for atypical lesions  Colorectal cancer: start at age 74  Discussed new guidelines, but she would like to hold off until at least age 80  Dose CT Chest recommended if Age 40-80 years, 65 pack-year currently smoking OR have quit w/in 15years. Patient does not qualify.   ECG: 04/27/2017   Advanced Care Planning: A voluntary discussion about advance care planning including the explanation and discussion of advance directives.  Discussed health care proxy and Living will, and the patient was able to identify a health care proxy as husband .  Patient does not have a living will at present time.   Lipids: Lab Results  Component Value Date   CHOL 148 10/15/2019   CHOL 158 09/12/2018   CHOL 158 06/03/2017   Lab Results  Component Value Date   HDL 48 (L) 10/15/2019   HDL 48 (L) 09/12/2018   HDL 53 06/03/2017   Lab Results  Component Value Date   LDLCALC 78 10/15/2019   LDLCALC 92 09/12/2018   LDLCALC 91 06/03/2017   Lab Results  Component Value Date   TRIG 119 10/15/2019   TRIG 88 09/12/2018   TRIG 54 06/03/2017   Lab Results  Component Value Date   CHOLHDL 3.1 10/15/2019   CHOLHDL 3.3 09/12/2018   CHOLHDL 3.0 06/03/2017   No results found for: LDLDIRECT  Glucose: Glucose  Date Value Ref Range Status  02/06/2012 117 (H) 65 - 99 mg/dL Final  01/29/2012 76 65 - 99 mg/dL Final    Glucose, Bld  Date Value Ref Range Status  10/15/2019 149 (H) 65 - 99 mg/dL Final    Comment:    .            Fasting reference interval . For someone without known diabetes, a glucose value >125 mg/dL indicates that they may have diabetes and this should be confirmed with a follow-up test. .   09/12/2018 118 65 - 139 mg/dL Final    Comment:    .        Non-fasting reference interval .   04/27/2017 109 (H) 65 - 99 mg/dL Final    Patient Active Problem List   Diagnosis Date Noted  . Diabetes mellitus type 2 in obese ([place]) 07/10/2015  . Family history of breast cancer 07/10/2015  . Family history of diabetes mellitus 07/10/2015  . Morbid obesity ([place]) 07/10/2015  . Vitamin D deficiency 07/10/2015    Past Surgical History:  Procedure Laterality Date  . abscess on buttock     had to be drained at the OR  . CESAREAN SECTION  06/03/2011  . CESAREAN SECTION  2014  . CHOLECYSTECTOMY  08/26/2011    Family History  Problem Relation Age of Onset  . Diabetes Mother   . Breast cancer Mother        Breast-Diagnosed in 2011 Stage 4  . Diabetes Maternal Aunt   . Diabetes Maternal Uncle   . Cancer Maternal Grandmother   . Heart failure Paternal Grandmother   . Stroke Paternal Grandfather     Social History   Socioeconomic History  . Marital status: Married    Spouse name: Antonio   . Number of children: 2  . Years of education: Not on file  . Highest education level: Not on file  Occupational History  . Occupation: Therapist, occupational: [employer]  Tobacco Use  . Smoking status: Never Smoker  . Smokeless tobacco: Never Used  Substance and Sexual Activity  . Alcohol use: Yes    Alcohol/week: 0.0 standard drinks    Comment: occassionally  . Drug use: No  . Sexual activity: Yes    Partners: Male    Birth control/protection: Surgical    Comment: Tubal Ligation  Other Topics Concern  . Not on file  Social History Narrative   Re-married, her  husband has two children from a previous relationship   They have two children together   Works at [employer] Strain: [place]   . Difficulty of Paying Living Expenses: Not hard at all  Food Insecurity: No Food Insecurity  .  Worried About Charity fundraiser in the Last Year: Never true  . Ran Out of Food in the Last Year: Never true  Transportation Needs: No Transportation Needs  . Lack of Transportation (Medical): No  . Lack of Transportation (Non-Medical): No  Physical Activity: Sufficiently Active  . Days of Exercise per Week: 5 days  . Minutes of Exercise per Session: 50 min  Stress: No Stress Concern Present  . Feeling of Stress : Not at all  Social Connections: Not Isolated  . Frequency of Communication with Friends and Family: More than three times a week  . Frequency of Social Gatherings with Friends and Family: Once a week  . Attends Religious Services: More than 4 times per year  . Active Member of Clubs or Organizations: Yes  . Attends Archivist Meetings: More than 4 times per year  . Marital Status: Married  Human resources officer Violence: Not At Risk  . Fear of Current or Ex-Partner: No  . Emotionally Abused: No  . Physically Abused: No  . Sexually Abused: No     Current Outpatient Medications:  .  Cyanocobalamin (B-12) 1000 MCG CAPS, Take 1 tablet by mouth daily., Disp: , Rfl:  .  VITAMIN D PO, Take 50 mcg by mouth daily., Disp: , Rfl:  .  Vitamin D, Ergocalciferol, (DRISDOL) 1.25 MG (50000 UNIT) CAPS capsule, Take 1 capsule (50,000 Units total) by mouth every 7 (seven) days., Disp: 12 capsule, Rfl: 0 .  valsartan-hydrochlorothiazide (DIOVAN HCT) 80-12.5 MG tablet, Take 1 tablet by mouth daily., Disp: 30 tablet, Rfl: 0  Allergies  Allergen Reactions  . Sulfamethoxazole-Trimethoprim Nausea And Vomiting and Other (See Comments)    Headache, Dizziness, Lightlessness, Sweating, Nausea        ROS  Constitutional: Negative for fever or weight change.  Respiratory: Negative for cough and shortness of breath.   Cardiovascular: Negative for chest pain or palpitations.  Gastrointestinal: Negative for abdominal pain, no bowel changes.  Musculoskeletal: Negative for gait problem or joint swelling.  Skin: Negative for rash.  Neurological: Negative for dizziness or headache.  No other specific complaints in a complete review of systems (except as listed in HPI above).  Objective  Vitals:   11/13/19 0814  BP: (!) 134/92  Pulse: 86  Resp: 16  Temp: (!) 96.9 F (36.1 C)  TempSrc: Temporal  SpO2: 97%  Weight: (!) 323 lb 6.4 oz (146.7 kg)  Height: 5' 9.5" (1.765 m)    Body mass index is 47.07 kg/m.  Physical Exam  Constitutional: Patient appears well-developed and well-nourished. No distress.  HENT: Head: Normocephalic and atraumatic. Ears: B TMs ok, no erythema or effusion; Nose: Nose normal. Not done Eyes: Conjunctivae and EOM are normal. Pupils are equal, round, and reactive to light. No scleral icterus.  Neck: Normal range of motion. Neck supple. No JVD present. No thyromegaly present.  Cardiovascular: Normal rate, regular rhythm and normal heart sounds.  No murmur heard. No BLE edema. Pulmonary/Chest: Effort normal and breath sounds normal. No respiratory distress. Abdominal: Soft. Bowel sounds are normal, no distension. There is no tenderness. no masses Breast: no lumps or masses, no nipple discharge or rashes FEMALE GENITALIA:  External genitalia normal External urethra normal Vaginal vault normal without discharge or lesions Cervix normal without discharge or lesions Bimanual exam normal without masses RECTAL: not done  Musculoskeletal: Normal range of motion, no joint effusions. No gross deformities Neurological: he is alert and oriented to person, place, and time. No cranial nerve deficit.  Coordination, balance, strength, speech and gait are normal.   Skin: Skin is warm and dry. No rash noted. No erythema.  Psychiatric: Patient has a normal mood and affect. behavior is normal. Judgment and thought content normal.  Recent Results (from the past 2160 hour(s))  Lipid panel     Status: Abnormal   Collection Time: 10/15/19 11:10 AM  Result Value Ref Range   Cholesterol 148 <200 mg/dL   HDL 48 (L) > OR = 50 mg/dL   Triglycerides 119 <150 mg/dL   LDL Cholesterol (Calc) 78 mg/dL (calc)    Comment: Reference range: <100 . Desirable range <100 mg/dL for primary prevention;   <70 mg/dL for patients with CHD or diabetic patients  with > or = 2 CHD risk factors. Marland Kitchen LDL-C is now calculated using the Martin-Hopkins  calculation, which is a validated novel method providing  better accuracy than the Friedewald equation in the  estimation of LDL-C.  Cresenciano Genre et al. Annamaria Helling. 7262;035(59): 2061-2068  (http://education.[facility].com/faq/FAQ164)    Total CHOL/HDL Ratio 3.1 <5.0 (calc)   Non-HDL Cholesterol (Calc) 100 <130 mg/dL (calc)    Comment: For patients with diabetes plus 1 major ASCVD risk  factor, treating to a non-HDL-C goal of <100 mg/dL  (LDL-C of <70 mg/dL) is considered a therapeutic  option.   COMPLETE METABOLIC PANEL WITH GFR     Status: Abnormal   Collection Time: 10/15/19 11:10 AM  Result Value Ref Range   Glucose, Bld 149 (H) 65 - 99 mg/dL    Comment: .            Fasting reference interval . For someone without known diabetes, a glucose value >125 mg/dL indicates that they may have diabetes and this should be confirmed with a follow-up test. .    BUN 10 7 - 25 mg/dL   Creat 0.63 0.50 - 1.10 mg/dL   GFR, Est Non African American 111 > OR = 60 mL/min/1.26m   GFR, Est African American 128 > OR = 60 mL/min/1.778m  BUN/Creatinine Ratio NOT APPLICABLE 6 - 22 (calc)   Sodium 138 135 - 146 mmol/L   Potassium 4.0 3.5 - 5.3 mmol/L   Chloride 103 98 - 110 mmol/L   CO2 28 20 - 32 mmol/L   Calcium 9.2 8.6 - 10.2 mg/dL    Total Protein 7.2 6.1 - 8.1 g/dL   Albumin 4.0 3.6 - 5.1 g/dL   Globulin 3.2 1.9 - 3.7 g/dL (calc)   AG Ratio 1.3 1.0 - 2.5 (calc)   Total Bilirubin 0.8 0.2 - 1.2 mg/dL   Alkaline phosphatase (APISO) 73 31 - 125 U/L   AST 15 10 - 30 U/L   ALT 16 6 - 29 U/L  CBC with Differential/Platelet     Status: None   Collection Time: 10/15/19 11:10 AM  Result Value Ref Range   WBC 7.2 3.8 - 10.8 Thousand/uL   RBC 4.59 3.80 - 5.10 Million/uL   Hemoglobin 12.9 11.7 - 15.5 g/dL   HCT 37.8 35.0 - 45.0 %   MCV 82.4 80.0 - 100.0 fL   MCH 28.1 27.0 - 33.0 pg   MCHC 34.1 32.0 - 36.0 g/dL   RDW 13.7 11.0 - 15.0 %   Platelets 231 140 - 400 Thousand/uL   MPV 11.0 7.5 - 12.5 fL   Neutro Abs 5,155 1,500 - 7,800 cells/uL   Lymphs Abs 1,548 850 - 3,900 cells/uL   Absolute Monocytes 367 200 - 950 cells/uL   Eosinophils  Absolute 86 15 - 500 cells/uL   Basophils Absolute 43 0 - 200 cells/uL   Neutrophils Relative % 71.6 %   Total Lymphocyte 21.5 %   Monocytes Relative 5.1 %   Eosinophils Relative 1.2 %   Basophils Relative 0.6 %  Hemoglobin A1c     Status: Abnormal   Collection Time: 10/15/19 11:10 AM  Result Value Ref Range   Hgb A1c MFr Bld 6.5 (H) <5.7 % of total Hgb    Comment: For someone without known diabetes, a hemoglobin A1c value of 6.5% or greater indicates that they may have  diabetes and this should be confirmed with a follow-up  test. . For someone with known diabetes, a value <7% indicates  that their diabetes is well controlled and a value  greater than or equal to 7% indicates suboptimal  control. A1c targets should be individualized based on  duration of diabetes, age, comorbid conditions, and  other considerations. . Currently, no consensus exists regarding use of hemoglobin A1c for diagnosis of diabetes for children. .    Mean Plasma Glucose 140 (calc)   eAG (mmol/L) 7.7 (calc)  TSH     Status: None   Collection Time: 10/15/19 11:10 AM  Result Value Ref Range   TSH  1.41 mIU/L    Comment:           Reference Range .           > or = 20 Years  0.40-4.50 .                Pregnancy Ranges           First trimester    0.26-2.66           Second trimester   0.55-2.73           Third trimester    0.43-2.91   Microalbumin / creatinine urine ratio     Status: None   Collection Time: 10/15/19 11:10 AM  Result Value Ref Range   Creatinine, Urine 73 20 - 275 mg/dL   Microalb, Ur 0.7 mg/dL    Comment: Reference Range Not established    Microalb Creat Ratio 10 <30 mcg/mg creat    Comment: . The ADA defines abnormalities in albumin excretion as follows: Marland Kitchen Category         Result (mcg/mg creatinine) . Normal                    <30 Microalbuminuria         30-299  Clinical albuminuria   > OR = 300 . The ADA recommends that at least two of three specimens collected within a 3-6 month period be abnormal before considering a patient to be within a diagnostic category.   VITAMIN D 25 Hydroxy (Vit-D Deficiency, Fractures)     Status: Abnormal   Collection Time: 10/15/19 11:10 AM  Result Value Ref Range   Vit D, 25-Hydroxy 19 (L) 30 - 100 ng/mL    Comment: Vitamin D Status         25-OH Vitamin D: . Deficiency:                    <20 ng/mL Insufficiency:             20 - 29 ng/mL Optimal:                 > or = 30 ng/mL . For 25-OH Vitamin D testing on patients  on  D2-supplementation and patients for whom quantitation  of D2 and D3 fractions is required, the [facility]ure[facility](TM) 25-OH VIT D, (D2,D3), LC/MS/MS is recommended: order  code [id number] (patients >89yr). See Note 1 . Note 1 . For additional information, please refer to  http://education.[facility].com/faq/FAQ199  (This link is being provided for informational/ educational purposes only.)     Diabetic Foot Exam: Diabetic Foot Exam - Simple   Simple Foot Form Diabetic Foot exam was performed with the following findings: Yes 11/13/2019  9:03 AM  Visual Inspection No deformities, no  ulcerations, no other skin breakdown bilaterally: Yes Sensation Testing Intact to touch and monofilament testing bilaterally: Yes Pulse Check Posterior Tibialis and Dorsalis pulse intact bilaterally: Yes Comments      Fall Risk: Fall Risk  11/13/2019 10/15/2019 09/12/2018 11/30/2017 04/29/2017  Falls in the past year? 0 0 0 No No  Number falls in past yr: 0 0 0 - -  Injury with Fall? 0 0 0 - -     Functional Status Survey: Is the patient deaf or have difficulty hearing?: No Does the patient have difficulty seeing, even when wearing glasses/contacts?: No Does the patient have difficulty concentrating, remembering, or making decisions?: No Does the patient have difficulty walking or climbing stairs?: No Does the patient have difficulty dressing or bathing?: No Does the patient have difficulty doing errands alone such as visiting a doctor's office or shopping?: No   Assessment & Plan  1. Well adult exam   2. Essential hypertension  - valsartan-hydrochlorothiazide (DIOVAN HCT) 80-12.5 MG tablet; Take 1 tablet by mouth daily.  Dispense: 30 tablet; Refill: 0 - BASIC METABOLIC PANEL WITH GFR  3. Encounter for screening mammogram for malignant neoplasm of breast  - MM Digital Screening; Future  4. Vitamin D deficiency  She will get rx vitamin D   5. Routine screening for STI (sexually transmitted infection)  - HIV Antibody (routine testing w rflx) - RPR  6. Cervical cancer screening  - Cytology - PAP   7. Need for vaccination for pneumococcus  Patient refused  -USPSTF grade A and B recommendations reviewed with patient; age-appropriate recommendations, preventive care, screening tests, etc discussed and encouraged; healthy living encouraged; see AVS for patient education given to patient -Discussed importance of 150 minutes of physical activity weekly, eat two servings of fish weekly, eat one serving of tree nuts ( cashews, pistachios, pecans, almonds..Marland Kitchen every other day,  eat 6 servings of fruit/vegetables daily and drink plenty of water and avoid sweet beverages.

## 2019-11-14 LAB — RPR: RPR Ser Ql: NONREACTIVE

## 2019-11-14 LAB — HIV ANTIBODY (ROUTINE TESTING W REFLEX): HIV 1&2 Ab, 4th Generation: NONREACTIVE

## 2019-11-15 ENCOUNTER — Encounter: Payer: Self-pay | Admitting: Family Medicine

## 2019-11-15 LAB — CYTOLOGY - PAP
Chlamydia: NEGATIVE
Comment: NEGATIVE
Comment: NEGATIVE
Comment: NORMAL
Diagnosis: HIGH — AB
High risk HPV: NEGATIVE
Neisseria Gonorrhea: NEGATIVE

## 2019-11-16 ENCOUNTER — Other Ambulatory Visit: Payer: Self-pay | Admitting: Family Medicine

## 2019-11-16 DIAGNOSIS — E890 Postprocedural hypothyroidism: Secondary | ICD-10-CM

## 2019-11-16 NOTE — Telephone Encounter (Signed)
Copied from CRM [id number]. Topic: General - Other >> Nov 15, 2019  3:53 PM Daphine Deutscher D wrote: Reason for CRM: Pt called saying she received her pap results in her my chart and she would like someone to call her back and explain them to her  CB# [phone]

## 2019-11-16 NOTE — Telephone Encounter (Signed)
Patient received her lab results by Mychart and seen her Pap smear was abnormal. Pap is concern about the abnormal cells but her HPV was negative and has questions if she needs a colposcopy or what is the next step. She also wanted to know she needs that BMP drawn. Please review labs and annotate so I can inform the patient.

## 2019-12-05 ENCOUNTER — Other Ambulatory Visit: Payer: Self-pay | Admitting: Family Medicine

## 2019-12-05 DIAGNOSIS — I10 Essential (primary) hypertension: Secondary | ICD-10-CM

## 2019-12-13 ENCOUNTER — Ambulatory Visit (INDEPENDENT_AMBULATORY_CARE_PROVIDER_SITE_OTHER): Payer: BC Managed Care – PPO

## 2019-12-13 ENCOUNTER — Other Ambulatory Visit: Payer: Self-pay

## 2019-12-13 VITALS — BP 108/68 | HR 70

## 2019-12-13 DIAGNOSIS — I10 Essential (primary) hypertension: Secondary | ICD-10-CM

## 2019-12-13 LAB — BASIC METABOLIC PANEL WITH GFR
BUN: 10 mg/dL (ref 7–25)
CO2: 28 mmol/L (ref 20–32)
Calcium: 9.5 mg/dL (ref 8.6–10.2)
Chloride: 104 mmol/L (ref 98–110)
Creat: 0.68 mg/dL (ref 0.50–1.10)
GFR, Est African American: 125 mL/min/{1.73_m2} (ref >=60)
GFR, Est Non African American: 108 mL/min/{1.73_m2} (ref >=60)
Glucose, Bld: 121 mg/dL — ABNORMAL HIGH (ref 65–99)
Potassium: 4.3 mmol/L (ref 3.5–5.3)
Sodium: 138 mmol/L (ref 135–146)

## 2019-12-13 NOTE — Progress Notes (Signed)
Patient is here for a blood pressure check. Patient denies chest pain, palpitations, shortness of breath or visual disturbances. At previous visit blood pressure was 134/92 with a heart rate of 86. Today during nurse visit first check blood pressure was 108/68 and heart rate was 70. She does take any blood pressure medications.  Spoke to patient , she denies dizziness, bp at home has been around 110, we will continue current dose. Advised her to stay hydrated over the Summer and she can try taking half a pill the week prior to have follow up .

## 2020-02-08 ENCOUNTER — Other Ambulatory Visit: Payer: Self-pay | Admitting: Family Medicine

## 2020-02-08 NOTE — Telephone Encounter (Signed)
Requested medication (s) are due for refill today: yes  Requested medication (s) are on the active medication list: yes  Last refill:  11/13/2019  Future visit scheduled: yes  Notes to clinic: this refill cannot be delegated    Requested Prescriptions  Pending Prescriptions Disp Refills   Vitamin D, Ergocalciferol, (DRISDOL) 1.25 MG (50000 UNIT) CAPS capsule [Pharmacy Med Name: VITAMIN D2 1.25MG (50,000 UNIT)] 12 capsule 0    Sig: Take 1 capsule (50,000 Units total) by mouth every 7 (seven) days.      Endocrinology:  Vitamins - Vitamin D Supplementation Failed - 02/08/2020  1:47 AM      Failed - 50,000 IU strengths are not delegated      Failed - Phosphate in normal range and within 360 days    No results found for: PHOS        Failed - Vitamin D in normal range and within 360 days    Vit D, 25-Hydroxy  Date Value Ref Range Status  10/15/2019 19 (L) 30 - 100 ng/mL Final    Comment:    Vitamin D Status         25-OH Vitamin D: . Deficiency:                    <20 ng/mL Insufficiency:             20 - 29 ng/mL Optimal:                 > or = 30 ng/mL . For 25-OH Vitamin D testing on patients on  D2-supplementation and patients for whom quantitation  of D2 and D3 fractions is required, the [facility](TM) 25-OH VIT D, (D2,D3), LC/MS/MS is recommended: order  code 74163 (patients >64yrs). See Note 1 . Note 1 . For additional information, please refer to  http://education.[facility].com/faq/FAQ199  (This link is being provided for informational/ educational purposes only.)           Passed - Ca in normal range and within 360 days    Calcium  Date Value Ref Range Status  12/13/2019 9.5 8.6 - 10.2 mg/dL Final   Calcium, Total  Date Value Ref Range Status  02/06/2012 8.8 8.5 - 10.1 mg/dL Final          Passed - Valid encounter within last 12 months    Recent Outpatient Visits           2 months ago Essential hypertension   [facility]  Alba Cory, MD   3 months ago Uncontrolled hypertension   [facility] Alba Cory, MD   1 year ago Breast cancer screening   [facility] Alba Cory, MD   2 years ago Swelling of right ear   [facility] Alba Cory, MD   2 years ago Routine screening for STI (sexually transmitted infection)   [facility] Alba Cory, MD       Future Appointments             In 3 months Carlynn Purl, Danna Hefty, MD [facility], [facility]

## 2020-04-15 ENCOUNTER — Encounter: Payer: Self-pay | Admitting: Family Medicine

## 2020-05-29 ENCOUNTER — Telehealth: Payer: Self-pay | Admitting: Family Medicine

## 2020-05-29 ENCOUNTER — Other Ambulatory Visit: Payer: Self-pay

## 2020-05-29 DIAGNOSIS — Z1231 Encounter for screening mammogram for malignant neoplasm of breast: Secondary | ICD-10-CM

## 2020-05-29 NOTE — Telephone Encounter (Signed)
Pt had to resch her appt for Oct 20th. She is asking for a orderl to get her mammogram done at [facility].

## 2020-05-30 ENCOUNTER — Ambulatory Visit: Payer: BC Managed Care – PPO | Admitting: Family Medicine

## 2020-06-17 ENCOUNTER — Other Ambulatory Visit: Payer: Self-pay | Admitting: Family Medicine

## 2020-06-17 DIAGNOSIS — I10 Essential (primary) hypertension: Secondary | ICD-10-CM

## 2020-06-17 NOTE — Telephone Encounter (Signed)
Requested Prescriptions  Pending Prescriptions Disp Refills   valsartan-hydrochlorothiazide (DIOVAN-HCT) 80-12.5 MG tablet [Pharmacy Med Name: VALSARTAN-HCTZ 80-12.5 MG TAB] 30 tablet 0    Sig: TAKE 1 TABLET BY MOUTH EVERY DAY     Cardiovascular: ARB + Diuretic Combos Failed - 06/17/2020  1:18 AM      Failed - K in normal range and within 180 days    Potassium  Date Value Ref Range Status  12/13/2019 4.3 3.5 - 5.3 mmol/L Final  02/06/2012 3.6 3.5 - 5.1 mmol/L Final         Failed - Na in normal range and within 180 days    Sodium  Date Value Ref Range Status  12/13/2019 138 135 - 146 mmol/L Final  02/06/2012 136 136 - 145 mmol/L Final         Failed - Cr in normal range and within 180 days    Creat  Date Value Ref Range Status  12/13/2019 0.68 0.50 - 1.10 mg/dL Final   Creatinine, Urine  Date Value Ref Range Status  10/15/2019 73 20 - 275 mg/dL Final         Failed - Ca in normal range and within 180 days    Calcium  Date Value Ref Range Status  12/13/2019 9.5 8.6 - 10.2 mg/dL Final   Calcium, Total  Date Value Ref Range Status  02/06/2012 8.8 8.5 - 10.1 mg/dL Final         Failed - Valid encounter within last 6 months    Recent Outpatient Visits          7 months ago Essential hypertension   [facility] Alba Cory, MD   8 months ago Uncontrolled hypertension   [facility] Alba Cory, MD   1 year ago Breast cancer screening   [facility] Alba Cory, MD   2 years ago Swelling of right ear   [facility] Alba Cory, MD   3 years ago Routine screening for STI (sexually transmitted infection)   [facility] Alba Cory, MD      Future Appointments            In 1 week Alba Cory, MD [facility], [facility]           Passed - Patient is not pregnant      Passed - Last BP in normal range    BP Readings from Last 1  Encounters:  12/13/19 108/68

## 2020-06-25 ENCOUNTER — Ambulatory Visit: Payer: BC Managed Care – PPO | Admitting: Family Medicine

## 2020-06-26 ENCOUNTER — Other Ambulatory Visit: Payer: Self-pay | Admitting: Family Medicine

## 2020-06-26 DIAGNOSIS — Z1231 Encounter for screening mammogram for malignant neoplasm of breast: Secondary | ICD-10-CM

## 2020-07-01 ENCOUNTER — Other Ambulatory Visit: Payer: Self-pay | Admitting: Family Medicine

## 2020-07-01 DIAGNOSIS — I10 Essential (primary) hypertension: Secondary | ICD-10-CM

## 2020-07-22 ENCOUNTER — Other Ambulatory Visit: Payer: Self-pay

## 2020-07-22 ENCOUNTER — Ambulatory Visit
Admission: RE | Admit: 2020-07-22 | Discharge: 2020-07-22 | Disposition: A | Discharge status: Home / Self Care | Payer: BC Managed Care – PPO | Source: Ambulatory Visit | Attending: Family Medicine | Admitting: Family Medicine

## 2020-07-22 DIAGNOSIS — Z1231 Encounter for screening mammogram for malignant neoplasm of breast: Secondary | ICD-10-CM | POA: Diagnosis not present

## 2020-08-20 NOTE — Progress Notes (Signed)
Name: Bridget Weiss   MRN: [id number]    DOB: 1975/11/28   Date:08/21/2020       Progress Note  Subjective  Chief Complaint  Follow Up Repeat PAP  HPI  HTN:she is on  diovan/hctz 80/12.5 mg daily, her bp today is at goal at 110/72, she states at home in the 120's/60's-70's. No side effects of medication. No chest pain , palpitation or sob   Vitamin D deficiency:she is taking otc supplementation   DMII: she has associated obesity and HTN. She is taking Diovan hctz 80/12.5 mg daily, she denies polyphagia, polydipsia or polyuria. A1C today is 6.4 %. She has been making healthier choices, she states trying to stay more active. She states sometimes she eats junk when working long hours but she eats very clean after a day of not eating well. Urine micro is up to date, discussed adding stating therapy   Morbid obesity: BMI above 40 , discussed weight loss regiments, and medication, she does not want medications, she states she wants to do it on her own.   Abnormal pap smear: we will recheck pap smear today   Dyslipidemia: she refuses statin therapy at this time, LDL at 78, HDL low discussed fish and tree nuts. We will monitor   Patient Active Problem List   Diagnosis Date Noted  . Hypertension, benign 08/21/2020  . Diabetes mellitus type 2 in obese (HCC) 07/10/2015  . Family history of breast cancer 07/10/2015  . Family history of diabetes mellitus 07/10/2015  . Morbid obesity (HCC) 07/10/2015  . Vitamin D deficiency 07/10/2015    Past Surgical History:  Procedure Laterality Date  . abscess on buttock     had to be drained at the OR  . CESAREAN SECTION  06/03/2011  . CESAREAN SECTION  2014  . CHOLECYSTECTOMY  08/26/2011    Family History  Problem Relation Age of Onset  . Diabetes Mother   . Breast cancer Mother        Breast-Diagnosed in 2011 Stage 4  . Diabetes Maternal Aunt   . Diabetes Maternal Uncle   . Cancer Maternal Grandmother   . Heart failure Paternal Grandmother    . Stroke Paternal Grandfather   . Breast cancer Cousin        pat cousin    Social History   Tobacco Use  . Smoking status: Never Smoker  . Smokeless tobacco: Never Used  Substance Use Topics  . Alcohol use: Yes    Alcohol/week: 0.0 standard drinks    Comment: occassionally     Current Outpatient Medications:  .  valsartan-hydrochlorothiazide (DIOVAN-HCT) 80-12.5 MG tablet, Take 1 tablet by mouth daily., Disp: 90 tablet, Rfl: 0 .  Vitamin D, Cholecalciferol, 25 MCG (1000 UT) CAPS, Take 1 capsule by mouth daily., Disp: 60 capsule, Rfl: 0  Allergies  Allergen Reactions  . Sulfamethoxazole-Trimethoprim Nausea And Vomiting and Other (See Comments)    Headache, Dizziness, Lightlessness, Sweating, Nausea      I personally reviewed active problem list, medication list, allergies, family history, social history, health maintenance with the patient/caregiver today.   ROS  Constitutional: Negative for fever or weight change.  Respiratory: Negative for cough and shortness of breath.   Cardiovascular: Negative for chest pain or palpitations.  Gastrointestinal: Negative for abdominal pain, no bowel changes.  Musculoskeletal: Negative for gait problem or joint swelling.  Skin: Negative for rash.  Neurological: Negative for dizziness or headache.  No other specific complaints in a complete review of systems (except  as listed in HPI above).  Objective  Vitals:   08/21/20 0846  BP: 110/72  Pulse: 96  Resp: 18  Temp: 98.2 F (36.8 C)  TempSrc: Oral  SpO2: 98%  Weight: (!) 320 lb 8 oz (145.4 kg)  Height: 5\' 10"  (1.778 m)    Body mass index is 45.99 kg/m.  Physical Exam  Constitutional: Patient appears well-developed and well-nourished. Obese  No distress.  HEENT: head atraumatic, normocephalic, pupils equal and reactive to light,neck supple Cardiovascular: Normal rate, regular rhythm and normal heart sounds.  No murmur heard. No BLE edema. Pulmonary/Chest: Effort  normal and breath sounds normal. No respiratory distress. Abdominal: Soft.  There is no tenderness. Pelvic: normal exam, pap smear collected  Psychiatric: Patient has a normal mood and affect. behavior is normal. Judgment and thought content normal.  Recent Results (from the past 2160 hour(s))  POCT HgB A1C     Status: Abnormal   Collection Time: 08/21/20  9:04 AM  Result Value Ref Range   Hemoglobin A1C 6.4 (A) 4.0 - 5.6 %   HbA1c POC (<> result, manual entry)     HbA1c, POC (prediabetic range)     HbA1c, POC (controlled diabetic range)        PHQ2/9: Depression screen [facility] 2/9 08/21/2020 11/13/2019 10/15/2019 09/12/2018 11/30/2017  Decreased Interest 0 0 0 0 0  Down, Depressed, Hopeless 0 0 0 0 0  PHQ - 2 Score 0 0 0 0 0  Altered sleeping - 0 0 - -  Tired, decreased energy - 0 0 - -  Change in appetite - 0 0 - -  Feeling bad or failure about yourself  - 0 0 - -  Trouble concentrating - 0 0 - -  Moving slowly or fidgety/restless - 0 0 - -  Suicidal thoughts - 0 0 - -  PHQ-9 Score - 0 0 - -  Difficult doing work/chores - - Not difficult at all - -    phq 9 is negative   Fall Risk: Fall Risk  08/21/2020 11/13/2019 10/15/2019 09/12/2018 11/30/2017  Falls in the past year? 0 0 0 0 No  Number falls in past yr: 0 0 0 0 -  Injury with Fall? 0 0 0 0 -     Functional Status Survey: Is the patient deaf or have difficulty hearing?: No Does the patient have difficulty seeing, even when wearing glasses/contacts?: No Does the patient have difficulty concentrating, remembering, or making decisions?: No Does the patient have difficulty walking or climbing stairs?: No Does the patient have difficulty dressing or bathing?: No Does the patient have difficulty doing errands alone such as visiting a doctor's office or shopping?: No    Assessment & Plan  1. Atypical squamous cells cannot exclude high grade squamous intraepithelial lesion on cytologic smear of cervix (ASC-H)  - Cytology -  PAP  2. Diabetes mellitus type 2 in obese (HCC)  - POCT HgB A1C  3. Morbid obesity (HCC)  Discussed with the patient the risk posed by an increased BMI. Discussed importance of portion control, calorie counting and at least 150 minutes of physical activity weekly. Avoid sweet beverages and drink more water. Eat at least 6 servings of fruit and vegetables daily   4. Essential hypertension  - valsartan-hydrochlorothiazide (DIOVAN-HCT) 80-12.5 MG tablet; Take 1 tablet by mouth daily.  Dispense: 90 tablet; Refill: 0  5. Vitamin D deficiency  - Vitamin D, Cholecalciferol, 25 MCG (1000 UT) CAPS; Take 1 capsule by mouth daily.  Dispense: 60 capsule; Refill: 0

## 2020-08-21 ENCOUNTER — Ambulatory Visit: Payer: BC Managed Care – PPO | Admitting: Family Medicine

## 2020-08-21 ENCOUNTER — Other Ambulatory Visit: Payer: Self-pay

## 2020-08-21 ENCOUNTER — Encounter: Payer: Self-pay | Admitting: Family Medicine

## 2020-08-21 ENCOUNTER — Other Ambulatory Visit (HOSPITAL_COMMUNITY)
Admission: RE | Admit: 2020-08-21 | Discharge: 2020-08-21 | Disposition: A | Discharge status: Home / Self Care | Payer: BC Managed Care – PPO | Source: Ambulatory Visit | Attending: Family Medicine | Admitting: Family Medicine

## 2020-08-21 VITALS — BP 110/72 | HR 96 | Temp 98.2°F | Resp 18 | Ht 70.0 in | Wt 320.5 lb

## 2020-08-21 DIAGNOSIS — E1169 Type 2 diabetes mellitus with other specified complication: Secondary | ICD-10-CM | POA: Diagnosis not present

## 2020-08-21 DIAGNOSIS — E669 Obesity, unspecified: Secondary | ICD-10-CM

## 2020-08-21 DIAGNOSIS — R87611 Atypical squamous cells cannot exclude high grade squamous intraepithelial lesion on cytologic smear of cervix (ASC-H): Secondary | ICD-10-CM | POA: Insufficient documentation

## 2020-08-21 DIAGNOSIS — E559 Vitamin D deficiency, unspecified: Secondary | ICD-10-CM

## 2020-08-21 DIAGNOSIS — I10 Essential (primary) hypertension: Secondary | ICD-10-CM | POA: Diagnosis not present

## 2020-08-21 LAB — POCT GLYCOSYLATED HEMOGLOBIN (HGB A1C): Hemoglobin A1C: 6.4 % — AB (ref 4.0–5.6)

## 2020-08-21 MED ORDER — VITAMIN D (CHOLECALCIFEROL) 25 MCG (1000 UT) PO CAPS: 1.0000 | ORAL_CAPSULE | Freq: Every day | ORAL | 0 refills | Status: DC

## 2020-08-21 MED ORDER — VALSARTAN-HYDROCHLOROTHIAZIDE 80-12.5 MG PO TABS: 1.0000 | ORAL_TABLET | Freq: Every day | ORAL | 0 refills | Status: DC

## 2020-08-25 LAB — CYTOLOGY - PAP
Chlamydia: NEGATIVE
Comment: NEGATIVE
Comment: NEGATIVE
Comment: NORMAL
Diagnosis: UNDETERMINED — AB
High risk HPV: NEGATIVE
Neisseria Gonorrhea: NEGATIVE

## 2020-08-27 ENCOUNTER — Telehealth: Payer: Self-pay

## 2020-08-27 NOTE — Telephone Encounter (Signed)
Copied from CRM [id number]. Topic: Quick Communication - Other Results (Clinic Use ONLY) >> Aug 27, 2020 10:15 AM Bridget Weiss wrote: Pt has looked at her pap smear results in mychart  and would like a nurse to go over the results

## 2020-08-27 NOTE — Telephone Encounter (Signed)
Answered all questions and went over results with patient. Pt gave verbal understanding.

## 2020-11-19 NOTE — Progress Notes (Addendum)
Name: Bridget Weiss   MRN: [id number]    DOB: 01-09-1976   Date:11/20/2020       Progress Note  Subjective  Chief Complaint  Annual Exam and Follow Up  HPI  Patient presents for annual CPE and follow up.  HTN:she is on  diovan/hctz 80/12.5 mg daily, her bp has been at goal here and also at home. No side effects of medication. No chest pain , palpitation or sob . She had a tubal ligation aware of teratogenic effects of ARB  Vitamin D deficiency:she is taking otc 2000 units daily we will recheck level today   DMII: diagnosed she has associated obesity,hyperlipidemia and  HTN. She is taking Diovan hctz 80/12.5 mg daily, she denies polyphagia, polydipsia or polyuria. A1C is again 6.4%  She has been making healthier choices, she has been exercising at least 30 minutes a day .  Urine micro is up to date, discussed adding stating therapy but she is not interested. Discussed diabetic medications that can help with weight loss but she is not interested at this time  Morbid obesity: BMI above 40 , weight is stable, she is avoiding fast food, she has been more active, she is feeling better   Abnormal pap smear: last one was done 08/2020 and ASCUS negative for HPV  Dyslipidemia: she refuses statin therapy at this time, LDL at 78, HDL low discussed fish and tree nuts. We will recheck labs   Diet: balanced  Exercise: continue regular activity   [place] [facility] Visit from 08/21/2020 in [facility]  AUDIT-C Score 0     Depression: Phq 9 is  negative Depression screen [facility] 2/9 11/20/2020 08/21/2020 11/13/2019 10/15/2019 09/12/2018  Decreased Interest 0 0 0 0 0  Down, Depressed, Hopeless 0 0 0 0 0  PHQ - 2 Score 0 0 0 0 0  Altered sleeping - - 0 0 -  Tired, decreased energy - - 0 0 -  Change in appetite - - 0 0 -  Feeling bad or failure about yourself  - - 0 0 -  Trouble concentrating - - 0 0 -  Moving slowly or fidgety/restless - - 0 0 -  Suicidal thoughts - - 0 0 -  PHQ-9  Score - - 0 0 -  Difficult doing work/chores - - - Not difficult at all -   Hypertension: BP Readings from Last 3 Encounters:  11/20/20 116/74  08/21/20 110/72  12/13/19 108/68   Obesity: Wt Readings from Last 3 Encounters:  11/20/20 (!) 322 lb (146.1 kg)  08/21/20 (!) 320 lb 8 oz (145.4 kg)  11/13/19 (!) 323 lb 6.4 oz (146.7 kg)   BMI Readings from Last 3 Encounters:  11/20/20 46.20 kg/m  08/21/20 45.99 kg/m  11/13/19 47.07 kg/m     Vaccines:   Pneumonia: educated and discussed with patient. She refused  Flu: educated and discussed with patient.  Hep C Screening: 09/12/18 STD testing and prevention (HIV/chl/gon/syphilis): 11/13/19 Intimate partner violence: negative Sexual History : no pain or discomfort, one partner, tubes tied  Menstrual History/LMP/Abnormal Bleeding: regular, alternates between heavy or light, lasts about 4-5 days.  Incontinence Symptoms:  No problems  Breast cancer:  - Last Mammogram: discussed yearly  - BRCA gene screening: mother and first cousin on father's side   Osteoporosis: Discussed high calcium and vitamin D supplementation, weight bearing exercises  Cervical cancer screening: 08/21/20  Skin cancer: Discussed monitoring for atypical lesions  Colorectal cancer: start at 45  ECG:  04/27/17  Advanced Care Planning: A voluntary discussion about advance care planning including the explanation and discussion of advance directives.  Discussed health care proxy and Living will, and the patient was able to identify a health care proxy as husband   Lipids: Lab Results  Component Value Date   CHOL 148 10/15/2019   CHOL 158 09/12/2018   CHOL 158 06/03/2017   Lab Results  Component Value Date   HDL 48 (L) 10/15/2019   HDL 48 (L) 09/12/2018   HDL 53 06/03/2017   Lab Results  Component Value Date   LDLCALC 78 10/15/2019   LDLCALC 92 09/12/2018   LDLCALC 91 06/03/2017   Lab Results  Component Value Date   TRIG 119 10/15/2019   TRIG 88  09/12/2018   TRIG 54 06/03/2017   Lab Results  Component Value Date   CHOLHDL 3.1 10/15/2019   CHOLHDL 3.3 09/12/2018   CHOLHDL 3.0 06/03/2017   No results found for: LDLDIRECT  Glucose: Glucose  Date Value Ref Range Status  02/06/2012 117 (H) 65 - 99 mg/dL Final  01/29/2012 76 65 - 99 mg/dL Final   Glucose, Bld  Date Value Ref Range Status  12/13/2019 121 (H) 65 - 99 mg/dL Final    Comment:    .            Fasting reference interval . For someone without known diabetes, a glucose value between 100 and 125 mg/dL is consistent with prediabetes and should be confirmed with a follow-up test. .   10/15/2019 149 (H) 65 - 99 mg/dL Final    Comment:    .            Fasting reference interval . For someone without known diabetes, a glucose value >125 mg/dL indicates that they may have diabetes and this should be confirmed with a follow-up test. .   09/12/2018 118 65 - 139 mg/dL Final    Comment:    .        Non-fasting reference interval .     Patient Active Problem List   Diagnosis Date Noted  . Hypertension, benign 08/21/2020  . Diabetes mellitus type 2 in obese ([place]) 07/10/2015  . Family history of breast cancer 07/10/2015  . Family history of diabetes mellitus 07/10/2015  . Morbid obesity ([place]) 07/10/2015  . Vitamin D deficiency 07/10/2015    Past Surgical History:  Procedure Laterality Date  . abscess on buttock     had to be drained at the OR  . CESAREAN SECTION  06/03/2011  . CESAREAN SECTION  2014  . CHOLECYSTECTOMY  08/26/2011    Family History  Problem Relation Age of Onset  . Diabetes Mother   . Breast cancer Mother        Breast-Diagnosed in 2011 Stage 4  . Diabetes Maternal Aunt   . Diabetes Maternal Uncle   . Cancer Maternal Grandmother   . Heart failure Paternal Grandmother   . Stroke Paternal Grandfather   . Breast cancer Cousin        pat cousin    Social History   Socioeconomic History  . Marital status: Married    Spouse  name: Antonio   . Number of children: 2  . Years of education: Not on file  . Highest education level: Not on file  Occupational History  . Occupation: Therapist, occupational: [employer]  Tobacco Use  . Smoking status: Never Smoker  . Smokeless tobacco: Never Used  Vaping Use  .  Vaping Use: Never used  Substance and Sexual Activity  . Alcohol use: Yes    Alcohol/week: 0.0 standard drinks    Comment: occassionally  . Drug use: No  . Sexual activity: Yes    Partners: Male    Birth control/protection: Surgical    Comment: Tubal Ligation  Other Topics Concern  . Not on file  Social History Narrative   Re-married, her husband has two children from a previous relationship   They have two children together   Works at [employer] Strain: [place]   . Difficulty of Paying Living Expenses: Not hard at all  Food Insecurity: No Food Insecurity  . Worried About Charity fundraiser in the Last Year: Never true  . Ran Out of Food in the Last Year: Never true  Transportation Needs: No Transportation Needs  . Lack of Transportation (Medical): No  . Lack of Transportation (Non-Medical): No  Physical Activity: Sufficiently Active  . Days of Exercise per Week: 7 days  . Minutes of Exercise per Session: 40 min  Stress: No Stress Concern Present  . Feeling of Stress : Not at all  Social Connections: Socially Integrated  . Frequency of Communication with Friends and Family: More than three times a week  . Frequency of Social Gatherings with Friends and Family: Once a week  . Attends Religious Services: More than 4 times per year  . Active Member of Clubs or Organizations: Yes  . Attends Archivist Meetings: Never  . Marital Status: Married  Human resources officer Violence: Not At Risk  . Fear of Current or Ex-Partner: No  . Emotionally Abused: No  . Physically Abused: No  . Sexually Abused: No     Current Outpatient  Medications:  .  Cholecalciferol (VITAMIN D) 50 MCG (2000 UT) CAPS, Take 1 capsule by mouth daily., Disp: , Rfl:  .  valsartan-hydrochlorothiazide (DIOVAN-HCT) 80-12.5 MG tablet, Take 1 tablet by mouth daily., Disp: 90 tablet, Rfl: 1  Allergies  Allergen Reactions  . Sulfamethoxazole-Trimethoprim Nausea And Vomiting and Other (See Comments)    Headache, Dizziness, Lightlessness, Sweating, Nausea       ROS  Constitutional: Negative for fever or weight change.  Respiratory: Negative for cough and shortness of breath.   Cardiovascular: Negative for chest pain or palpitations.  Gastrointestinal: Negative for abdominal pain, no bowel changes.  Musculoskeletal: Negative for gait problem or joint swelling.  Skin: Negative for rash.  Neurological: Negative for dizziness or headache.  No other specific complaints in a complete review of systems (except as listed in HPI above).  Objective  Vitals:   11/20/20 0740  BP: 116/74  Pulse: 87  Resp: 16  Temp: 98.3 F (36.8 C)  TempSrc: Oral  SpO2: 99%  Weight: (!) 322 lb (146.1 kg)  Height: _0  (1.778 m)    Body mass index is 46.2 kg/m.  Physical Exam  Constitutional: Patient appears well-developed and well-nourished, obese . No distress.  HENT: Head: Normocephalic and atraumatic. Ears: B TMs ok, no erythema or effusion; Nose: Not done . Mouth/Throat: not done   Eyes: Conjunctivae and EOM are normal. Pupils are equal, round, and reactive to light. No scleral icterus.  Neck: Normal range of motion. Neck supple. No JVD present. No thyromegaly present.  Cardiovascular: Normal rate, regular rhythm and normal heart sounds.  No murmur heard. No BLE edema. Pulmonary/Chest: Effort normal and breath sounds normal. No respiratory distress. Abdominal: Soft. Bowel  sounds are normal, no distension. There is no tenderness. no masses Breast: no lumps or masses, no nipple discharge or rashes ,right breast much larger than left side  FEMALE  GENITALIA:  Not done  RECTAL: not done  Musculoskeletal: Normal range of motion, no joint effusions. No gross deformities Neurological: he is alert and oriented to person, place, and time. No cranial nerve deficit. Coordination, balance, strength, speech and gait are normal.  Skin: Skin is warm and dry. No rash noted. No erythema.  Psychiatric: Patient has a normal mood and affect. behavior is normal. Judgment and thought content normal.   Recent Results (from the past 2160 hour(s))  POCT HgB A1C     Status: Abnormal   Collection Time: 11/20/20  7:49 AM  Result Value Ref Range   Hemoglobin A1C 6.4 (A) 4.0 - 5.6 %   HbA1c POC (<> result, manual entry)     HbA1c, POC (prediabetic range)     HbA1c, POC (controlled diabetic range)      Diabetic Foot Exam: Diabetic Foot Exam - Simple   Simple Foot Form Diabetic Foot exam was performed with the following findings: Yes 11/20/2020  8:16 AM  Visual Inspection No deformities, no ulcerations, no other skin breakdown bilaterally: Yes Sensation Testing Intact to touch and monofilament testing bilaterally: Yes Pulse Check Posterior Tibialis and Dorsalis pulse intact bilaterally: Yes Comments     Fall Risk: Fall Risk  11/20/2020 08/21/2020 11/13/2019 10/15/2019 09/12/2018  Falls in the past year? 0 0 0 0 0  Number falls in past yr: 0 0 0 0 0  Injury with Fall? 0 0 0 0 0    Functional Status Survey: Is the patient deaf or have difficulty hearing?: No Does the patient have difficulty seeing, even when wearing glasses/contacts?: No Does the patient have difficulty concentrating, remembering, or making decisions?: No Does the patient have difficulty walking or climbing stairs?: No Does the patient have difficulty dressing or bathing?: No Does the patient have difficulty doing errands alone such as visiting a doctor's office or shopping?: No   Assessment & Plan  1. Diabetes mellitus type 2 in obese ([facility])  - POCT HgB A1C - HM Diabetes Foot  Exam - Microalbumin / creatinine urine ratio  2. Morbid obesity ([place])  Discussed with the patient the risk posed by an increased BMI. Discussed importance of portion control, calorie counting and at least 150 minutes of physical activity weekly. Avoid sweet beverages and drink more water. Eat at least 6 servings of fruit and vegetables daily   3. Essential hypertension  - Microalbumin / creatinine urine ratio - COMPLETE METABOLIC PANEL WITH GFR - CBC with Differential/Platelet - valsartan-hydrochlorothiazide (DIOVAN-HCT) 80-12.5 MG tablet; Take 1 tablet by mouth daily.  Dispense: 90 tablet; Refill: 1  4. ASCUS of cervix with negative high risk HPV   5. Well adult exam   6. Vitamin D deficiency  - VITAMIN D 25 Hydroxy (Vit-D Deficiency, Fractures)  7. Other fatigue   - TSH  8. Dyslipidemia associated with type 2 diabetes mellitus (HCC)  - HM Diabetes Foot Exam - Lipid panel - Microalbumin / creatinine urine ratio  9. Routine screening for STI (sexually transmitted infection)  - RPR - HIV Antibody (routine testing w rflx)  -USPSTF grade A and B recommendations reviewed with patient; age-appropriate recommendations, preventive care, screening tests, etc discussed and encouraged; healthy living encouraged; see AVS for patient education given to patient -Discussed importance of 150 minutes of physical activity weekly, eat two  servings of fish weekly, eat one serving of tree nuts ( cashews, pistachios, pecans, almonds.Marland Kitchen) every other day, eat 6 servings of fruit/vegetables daily and drink plenty of water and avoid sweet beverages.

## 2020-11-20 ENCOUNTER — Other Ambulatory Visit: Payer: Self-pay

## 2020-11-20 ENCOUNTER — Encounter: Payer: Self-pay | Admitting: Family Medicine

## 2020-11-20 ENCOUNTER — Ambulatory Visit (INDEPENDENT_AMBULATORY_CARE_PROVIDER_SITE_OTHER): Payer: BC Managed Care – PPO | Admitting: Family Medicine

## 2020-11-20 VITALS — BP 116/74 | HR 87 | Temp 98.3°F | Resp 16 | Ht 70.0 in | Wt 322.0 lb

## 2020-11-20 DIAGNOSIS — E559 Vitamin D deficiency, unspecified: Secondary | ICD-10-CM

## 2020-11-20 DIAGNOSIS — R87610 Atypical squamous cells of undetermined significance on cytologic smear of cervix (ASC-US): Secondary | ICD-10-CM

## 2020-11-20 DIAGNOSIS — E785 Hyperlipidemia, unspecified: Secondary | ICD-10-CM

## 2020-11-20 DIAGNOSIS — E890 Postprocedural hypothyroidism: Secondary | ICD-10-CM

## 2020-11-20 DIAGNOSIS — R5383 Other fatigue: Secondary | ICD-10-CM

## 2020-11-20 DIAGNOSIS — I10 Essential (primary) hypertension: Secondary | ICD-10-CM

## 2020-11-20 DIAGNOSIS — E669 Obesity, unspecified: Secondary | ICD-10-CM

## 2020-11-20 DIAGNOSIS — E1169 Type 2 diabetes mellitus with other specified complication: Secondary | ICD-10-CM

## 2020-11-20 DIAGNOSIS — Z0000 Encounter for general adult medical examination without abnormal findings: Secondary | ICD-10-CM

## 2020-11-20 DIAGNOSIS — Z113 Encounter for screening for infections with a predominantly sexual mode of transmission: Secondary | ICD-10-CM

## 2020-11-20 LAB — POCT GLYCOSYLATED HEMOGLOBIN (HGB A1C): Hemoglobin A1C: 6.4 % — AB (ref 4.0–5.6)

## 2020-11-20 MED ORDER — VALSARTAN-HYDROCHLOROTHIAZIDE 80-12.5 MG PO TABS: 1.0000 | ORAL_TABLET | Freq: Every day | ORAL | 1 refills | Status: DC

## 2020-11-20 NOTE — Patient Instructions (Addendum)
[facility]   Preventive Care 70-43 Years Old, Female Preventive care refers to lifestyle choices and visits with your health care provider that can promote health and wellness. This includes:  A yearly physical exam. This is also called an annual wellness visit.  Regular dental and eye exams.  Immunizations.  Screening for certain conditions.  Healthy lifestyle choices, such as: ? Eating a healthy diet. ? Getting regular exercise. ? Not using drugs or products that contain nicotine and tobacco. ? Limiting alcohol use. What can I expect for my preventive care visit? Physical exam Your health care provider will check your:  Height and weight. These may be used to calculate your BMI (body mass index). BMI is a measurement that tells if you are at a healthy weight.  Heart rate and blood pressure.  Body temperature.  Skin for abnormal spots. Counseling Your health care provider may ask you questions about your:  Past medical problems.  Family's medical history.  Alcohol, tobacco, and drug use.  Emotional well-being.  Home life and relationship well-being.  Sexual activity.  Diet, exercise, and sleep habits.  Work and work Statistician.  Access to firearms.  Method of birth control.  Menstrual cycle.  Pregnancy history. What immunizations do I need? Vaccines are usually given at various ages, according to a schedule. Your health care provider will recommend vaccines for you based on your age, medical history, and lifestyle or other factors, such as travel or where you work.   What tests do I need? Blood tests  Lipid and cholesterol levels. These may be checked every 5 years, or more often if you are over 78 years old.  Hepatitis C test.  Hepatitis B test. Screening  Lung cancer screening. You may have this screening every year starting at age 23 if you have a 30-pack-year history of smoking and currently smoke or have quit within the past 15  years.  Colorectal cancer screening. ? All adults should have this screening starting at age 40 and continuing until age 36. ? Your health care provider may recommend screening at age 51 if you are at increased risk. ? You will have tests every 1-10 years, depending on your results and the type of screening test.  Diabetes screening. ? This is done by checking your blood sugar (glucose) after you have not eaten for a while (fasting). ? You may have this done every 1-3 years.  Mammogram. ? This may be done every 1-2 years. ? Talk with your health care provider about when you should start having regular mammograms. This may depend on whether you have a family history of breast cancer.  BRCA-related cancer screening. This may be done if you have a family history of breast, ovarian, tubal, or peritoneal cancers.  Pelvic exam and Pap test. ? This may be done every 3 years starting at age 92. ? Starting at age 10, this may be done every 5 years if you have a Pap test in combination with an HPV test. Other tests  STD (sexually transmitted disease) testing, if you are at risk.  Bone density scan. This is done to screen for osteoporosis. You may have this scan if you are at high risk for osteoporosis. Talk with your health care provider about your test results, treatment options, and if necessary, the need for more tests. Follow these instructions at home: Eating and drinking  Eat a diet that includes fresh fruits and vegetables, whole grains, lean protein, and low-fat dairy products.  Take vitamin  and mineral supplements as recommended by your health care provider.  Do not drink alcohol if: ? Your health care provider tells you not to drink. ? You are pregnant, may be pregnant, or are planning to become pregnant.  If you drink alcohol: ? Limit how much you have to 0-1 drink a day. ? Be aware of how much alcohol is in your drink. In the [place] one drink equals one 12 oz bottle of beer  (355 mL), one 5 oz glass of wine (148 mL), or one 1 oz glass of hard liquor (44 mL).   Lifestyle  Take daily care of your teeth and gums. Brush your teeth every morning and night with fluoride toothpaste. Floss one time each day.  Stay active. Exercise for at least 30 minutes 5 or more days each week.  Do not use any products that contain nicotine or tobacco, such as cigarettes, e-cigarettes, and chewing tobacco. If you need help quitting, ask your health care provider.  Do not use drugs.  If you are sexually active, practice safe sex. Use a condom or other form of protection to prevent STIs (sexually transmitted infections).  If you do not wish to become pregnant, use a form of birth control. If you plan to become pregnant, see your health care provider for a prepregnancy visit.  If told by your health care provider, take low-dose aspirin daily starting at age 81.  Find healthy ways to cope with stress, such as: ? Meditation, yoga, or listening to music. ? Journaling. ? Talking to a trusted person. ? Spending time with friends and family. Safety  Always wear your seat belt while driving or riding in a vehicle.  Do not drive: ? If you have been drinking alcohol. Do not ride with someone who has been drinking. ? When you are tired or distracted. ? While texting.  Wear a helmet and other protective equipment during sports activities.  If you have firearms in your house, make sure you follow all gun safety procedures. What's next?  Visit your health care provider once a year for an annual wellness visit.  Ask your health care provider how often you should have your eyes and teeth checked.  Stay up to date on all vaccines. This information is not intended to replace advice given to you by your health care provider. Make sure you discuss any questions you have with your health care provider. Document Revised: 05/27/2020 Document Reviewed: 05/04/2018 Elsevier Patient Education   2021 [employer].

## 2020-11-21 LAB — COMPLETE METABOLIC PANEL WITH GFR
AG Ratio: 1.3 (calc) (ref 1.0–2.5)
ALT: 14 U/L (ref 6–29)
AST: 14 U/L (ref 10–30)
Albumin: 4.1 g/dL (ref 3.6–5.1)
Alkaline phosphatase (APISO): 75 U/L (ref 31–125)
BUN: 12 mg/dL (ref 7–25)
CO2: 27 mmol/L (ref 20–32)
Calcium: 9 mg/dL (ref 8.6–10.2)
Chloride: 106 mmol/L (ref 98–110)
Creat: 0.64 mg/dL (ref 0.50–1.10)
GFR, Est African American: 127 mL/min/{1.73_m2} (ref >=60)
GFR, Est Non African American: 109 mL/min/{1.73_m2} (ref >=60)
Globulin: 3.1 g/dL (calc) (ref 1.9–3.7)
Glucose, Bld: 111 mg/dL — ABNORMAL HIGH (ref 65–99)
Potassium: 4.4 mmol/L (ref 3.5–5.3)
Sodium: 141 mmol/L (ref 135–146)
Total Bilirubin: 0.8 mg/dL (ref 0.2–1.2)
Total Protein: 7.2 g/dL (ref 6.1–8.1)

## 2020-11-21 LAB — RPR: RPR Ser Ql: NONREACTIVE

## 2020-11-21 LAB — MICROALBUMIN / CREATININE URINE RATIO
Creatinine, Urine: 201 mg/dL (ref 20–275)
Microalb Creat Ratio: 2 mcg/mg creat (ref <30)
Microalb, Ur: 0.5 mg/dL

## 2020-11-21 LAB — LIPID PANEL
Cholesterol: 154 mg/dL (ref <200)
HDL: 49 mg/dL — ABNORMAL LOW (ref >=50)
LDL Cholesterol (Calc): 86 mg/dL (calc)
Non-HDL Cholesterol (Calc): 105 mg/dL (calc) (ref <130)
Total CHOL/HDL Ratio: 3.1 (calc) (ref <5.0)
Triglycerides: 97 mg/dL (ref <150)

## 2020-11-21 LAB — CBC WITH DIFFERENTIAL/PLATELET
Absolute Monocytes: 384 cells/uL (ref 200–950)
Basophils Absolute: 32 cells/uL (ref 0–200)
Basophils Relative: 0.5 %
Eosinophils Absolute: 90 cells/uL (ref 15–500)
Eosinophils Relative: 1.4 %
HCT: 37.5 % (ref 35.0–45.0)
Hemoglobin: 12.5 g/dL (ref 11.7–15.5)
Lymphs Abs: 1626 cells/uL (ref 850–3900)
MCH: 28.2 pg (ref 27.0–33.0)
MCHC: 33.3 g/dL (ref 32.0–36.0)
MCV: 84.5 fL (ref 80.0–100.0)
MPV: 10.9 fL (ref 7.5–12.5)
Monocytes Relative: 6 %
Neutro Abs: 4269 cells/uL (ref 1500–7800)
Neutrophils Relative %: 66.7 %
Platelets: 204 10*3/uL (ref 140–400)
RBC: 4.44 10*6/uL (ref 3.80–5.10)
RDW: 13.2 % (ref 11.0–15.0)
Total Lymphocyte: 25.4 %
WBC: 6.4 10*3/uL (ref 3.8–10.8)

## 2020-11-21 LAB — VITAMIN D 25 HYDROXY (VIT D DEFICIENCY, FRACTURES): Vit D, 25-Hydroxy: 30 ng/mL (ref 30–100)

## 2020-11-21 LAB — HIV ANTIBODY (ROUTINE TESTING W REFLEX): HIV 1&2 Ab, 4th Generation: NONREACTIVE

## 2020-11-21 LAB — TSH: TSH: 2.17 mIU/L

## 2021-02-05 ENCOUNTER — Telehealth: Payer: Self-pay

## 2021-02-05 NOTE — Telephone Encounter (Signed)
Spoke with pt to let her know that [name] is not here on Fri and that [name] does not have any openings. Will call her if we have any cancellations for tomorrw at any time for she is off tomorrow.        Copied from CRM [id number]. Topic: Appointment Scheduling - Scheduling Inquiry for Clinic >> Feb 04, 2021  4:45 PM Bridget Weiss wrote:  [phone] pls call pt as has a very bad earache was hoping to see [name] tomorrow?

## 2021-03-03 ENCOUNTER — Ambulatory Visit (INDEPENDENT_AMBULATORY_CARE_PROVIDER_SITE_OTHER): Payer: BC Managed Care – PPO | Admitting: Family Medicine

## 2021-03-03 ENCOUNTER — Encounter: Payer: Self-pay | Admitting: Family Medicine

## 2021-03-03 ENCOUNTER — Other Ambulatory Visit: Payer: Self-pay

## 2021-03-03 VITALS — BP 132/84 | HR 81 | Temp 98.3°F | Resp 16 | Ht 70.0 in | Wt 327.0 lb

## 2021-03-03 DIAGNOSIS — K141 Geographic tongue: Secondary | ICD-10-CM

## 2021-03-03 DIAGNOSIS — H6982 Other specified disorders of Eustachian tube, left ear: Secondary | ICD-10-CM | POA: Diagnosis not present

## 2021-03-03 MED ORDER — FLUTICASONE PROPIONATE 50 MCG/ACT NA SUSP: 2.0000 | Freq: Every day | NASAL | 6 refills | Status: DC

## 2021-03-03 NOTE — Patient Instructions (Signed)
Geographic tongue

## 2021-03-03 NOTE — Progress Notes (Signed)
Name: Bridget Weiss   MRN: [id number]    DOB: [birth year]   Date:03/03/2021       Progress Note  Subjective  Chief Complaint  Left Ear Muffled Hearing/ Spot on Tongue  HPI  Left Ear Fullness: she developed symptoms of Otitis Externa about one month ago, she could not get an appointment with [place], therefore she took 4 days of an old antibiotic she had at home and used otc ear drops. The swelling and pain resolved, however she continues to feel like there is ear fullness and sound seems muffled from left side . No fever or chills.   Spot on tongue: she also noticed it about one month ago, it comes and goes, red , no pain or itching, just concerned because it is new and red.   Patient Active Problem List   Diagnosis Date Noted   Hypertension, benign 08/21/2020   Diabetes mellitus type 2 in obese (HCC) 07/10/2015   Family history of breast cancer 07/10/2015   Family history of diabetes mellitus 07/10/2015   Morbid obesity (HCC) 07/10/2015   Vitamin D deficiency 07/10/2015    Past Surgical History:  Procedure Laterality Date   abscess on buttock     had to be drained at the OR   CESAREAN SECTION  06/03/2011   CESAREAN SECTION  2014   CHOLECYSTECTOMY  08/26/2011    Family History  Problem Relation Age of Onset   Diabetes Mother    Breast cancer Mother        Breast-Diagnosed in 2011 Stage 4   Diabetes Maternal Aunt    Diabetes Maternal Uncle    Cancer Maternal Grandmother    Heart failure Paternal Grandmother    Stroke Paternal Grandfather    Breast cancer Cousin        pat cousin    Social History   Tobacco Use   Smoking status: Never   Smokeless tobacco: Never  Substance Use Topics   Alcohol use: Yes    Alcohol/week: 0.0 standard drinks    Comment: occassionally     Current Outpatient Medications:    Cholecalciferol (VITAMIN D) 50 MCG (2000 UT) CAPS, Take 1 capsule by mouth daily., Disp: , Rfl:    valsartan-hydrochlorothiazide (DIOVAN-HCT) 80-12.5 MG tablet, Take 1  tablet by mouth daily., Disp: 90 tablet, Rfl: 1  Allergies  Allergen Reactions   Sulfamethoxazole-Trimethoprim Nausea And Vomiting and Other (See Comments)    Headache, Dizziness, Lightlessness, Sweating, Nausea      I personally reviewed active problem list, medication list, allergies, family history, social history, health maintenance with the patient/caregiver today.   ROS  Ten systems reviewed and is negative except as mentioned in HPI   Objective  Vitals:   03/03/21 1445  BP: 132/84  Pulse: 81  Resp: 16  Temp: 98.3 F (36.8 C)  TempSrc: Oral  SpO2: 97%  Weight: (!) 327 lb (148.3 kg)  Height: 5\' 10"  (1.778 m)    Body mass index is 46.92 kg/m.  Physical Exam  Constitutional: Patient appears well-developed and well-nourished. Obese  No distress.  HEENT: head atraumatic, normocephalic, pupils equal and reactive to light, ears normal TM and ear canal, neck supple, throat within normal limits, tongue has a well demarcated red spot smaller than a dime in size on right side of tip of tongue  Cardiovascular: Normal rate, regular rhythm and normal heart sounds.  No murmur heard. No BLE edema. Pulmonary/Chest: Effort normal and breath sounds normal. No respiratory distress. Abdominal: Soft.  There is no tenderness. Psychiatric: Patient has a normal mood and affect. behavior is normal. Judgment and thought content normal.   PHQ2/9: Depression screen [facility] 2/9 03/03/2021 11/20/2020 08/21/2020 11/13/2019 10/15/2019  Decreased Interest 0 0 0 0 0  Down, Depressed, Hopeless 0 0 0 0 0  PHQ - 2 Score 0 0 0 0 0  Altered sleeping - - - 0 0  Tired, decreased energy - - - 0 0  Change in appetite - - - 0 0  Feeling bad or failure about yourself  - - - 0 0  Trouble concentrating - - - 0 0  Moving slowly or fidgety/restless - - - 0 0  Suicidal thoughts - - - 0 0  PHQ-9 Score - - - 0 0  Difficult doing work/chores - - - - Not difficult at all    phq 9 is negative   Fall Risk: Fall  Risk  03/03/2021 11/20/2020 08/21/2020 11/13/2019 10/15/2019  Falls in the past year? 0 0 0 0 0  Number falls in past yr: 0 0 0 0 0  Injury with Fall? 0 0 0 0 0     Functional Status Survey: Is the patient deaf or have difficulty hearing?: No Does the patient have difficulty seeing, even when wearing glasses/contacts?: No Does the patient have difficulty concentrating, remembering, or making decisions?: No Does the patient have difficulty walking or climbing stairs?: No Does the patient have difficulty dressing or bathing?: No Does the patient have difficulty doing errands alone such as visiting a doctor's office or shopping?: No    Assessment & Plan  1. Eustachian tube dysfunction, left  - fluticasone (FLONASE) 50 MCG/ACT nasal spray; Place 2 sprays into both nostrils daily.  Dispense: 16 g; Refill: 6   2. Geographic tongue  Discussed benign condition, can continue peroxide rinses

## 2021-03-23 ENCOUNTER — Ambulatory Visit: Payer: BC Managed Care – PPO | Admitting: Family Medicine

## 2021-06-01 ENCOUNTER — Ambulatory Visit: Payer: BC Managed Care – PPO | Admitting: Family Medicine

## 2021-07-14 ENCOUNTER — Ambulatory Visit: Payer: BC Managed Care – PPO | Admitting: Family Medicine

## 2021-08-14 NOTE — Progress Notes (Signed)
Name: Bridget Weiss   MRN: [id number]    DOB: 1976/06/12   Date:08/17/2021       Progress Note  Subjective  Chief Complaint  Follow Up  HPI  HTN:she is on  diovan/hctz 80/12.5 mg daily, her bp has been at goal at home 130/80 , a little higher in our office but she does not want to increase the dose at this time.   Vitamin D deficiency:she is taking otc 2000 units daily .   DMII: diagnosed she has associated obesity,hyperlipidemia and  HTN. She is taking Diovan hctz 80/12.5 mg daily. A1C is again 6.4%  She has been making healthier choices, she has not as active over the past few weeks.   Urine micro is up to date, discussed adding stating therapy but she is not interested. Discussed diabetic medications that can help with weight loss but she is not interested at this time. She denies polyphagia, polydipsia or polyuria.   Morbid obesity: BMI above 40 , weight is stable, she is avoiding fast food, she was working out 3-4 times per week but stopped after flu like illness in Nov followed by palpitation   Abnormal pap smear: last one was done 08/2020 and ASCUS negative for HPV  Dyslipidemia: she refuses statin therapy at this time, LDL went up from 78 to 86 HDL low  at 49, she has been eating almonds occasionally   Palpitation: she states she had flu like symptoms early Nov and had to get the flu shot on Nov 18 th for work, the following week she noticed palpitation even at rest , lasting a few seconds and resolved with deep inspiration.. She states episodes are present most days of the week usually once a day. Sometimes the palpitation makes her cough No SOB or diaphoresis   Patient Active Problem List   Diagnosis Date Noted   Hypertension, benign 08/21/2020   Diabetes mellitus type 2 in obese (HCC) 07/10/2015   Family history of breast cancer 07/10/2015   Family history of diabetes mellitus 07/10/2015   Morbid obesity (HCC) 07/10/2015   Vitamin D deficiency 07/10/2015    Past Surgical  History:  Procedure Laterality Date   abscess on buttock     had to be drained at the OR   CESAREAN SECTION  06/03/2011   CESAREAN SECTION  2014   CHOLECYSTECTOMY  08/26/2011    Family History  Problem Relation Age of Onset   Diabetes Mother    Breast cancer Mother        Breast-Diagnosed in 2011 Stage 4   Diabetes Maternal Aunt    Diabetes Maternal Uncle    Cancer Maternal Grandmother    Heart failure Paternal Grandmother    Stroke Paternal Grandfather    Breast cancer Cousin        pat cousin    Social History   Tobacco Use   Smoking status: Never   Smokeless tobacco: Never  Substance Use Topics   Alcohol use: Yes    Alcohol/week: 0.0 standard drinks    Comment: occassionally     Current Outpatient Medications:    Cholecalciferol (VITAMIN D) 50 MCG (2000 UT) CAPS, Take 1 capsule by mouth daily., Disp: , Rfl:    valsartan-hydrochlorothiazide (DIOVAN-HCT) 80-12.5 MG tablet, Take 1 tablet by mouth daily., Disp: 90 tablet, Rfl: 1   fluticasone (FLONASE) 50 MCG/ACT nasal spray, Place 2 sprays into both nostrils daily. (Patient not taking: Reported on 08/17/2021), Disp: 16 g, Rfl: 6  Allergies  Allergen  Reactions   Sulfamethoxazole-Trimethoprim Nausea And Vomiting and Other (See Comments)    Headache, Dizziness, Lightlessness, Sweating, Nausea      I personally reviewed active problem list, medication list, allergies, family history, social history, health maintenance with the patient/caregiver today.   ROS  Constitutional: Negative for fever or weight change.  Respiratory: Negative for cough and shortness of breath.   Cardiovascular: Negative for chest pain or palpitations.  Gastrointestinal: Negative for abdominal pain, no bowel changes.  Musculoskeletal: Negative for gait problem or joint swelling.  Skin: Negative for rash.  Neurological: Negative for dizziness or headache.  No other specific complaints in a complete review of systems (except as listed in  HPI above).   Objective  Vitals:   08/17/21 1418  BP: 138/86  Pulse: 78  Resp: 18  Temp: 98.4 F (36.9 C)  TempSrc: Oral  SpO2: 98%  Weight: (!) 321 lb 12.8 oz (146 kg)  Height: 5\' 10"  (1.778 m)    Body mass index is 46.17 kg/m.  Physical Exam  Constitutional: Patient appears well-developed and well-nourished. Obese  No distress.  HEENT: head atraumatic, normocephalic, pupils equal and reactive to light, neck supple Cardiovascular: Normal rate, regular rhythm and normal heart sounds.  No murmur heard. No BLE edema. Pulmonary/Chest: Effort normal and breath sounds normal. No respiratory distress. Abdominal: Soft.  There is no tenderness. Psychiatric: Patient has a normal mood and affect. behavior is normal. Judgment and thought content normal.   Recent Results (from the past 2160 hour(s))  POCT HgB A1C     Status: Abnormal   Collection Time: 08/17/21  2:13 PM  Result Value Ref Range   Hemoglobin A1C 6.4 (A) 4.0 - 5.6 %   HbA1c POC (<> result, manual entry)     HbA1c, POC (prediabetic range)     HbA1c, POC (controlled diabetic range)       PHQ2/9: Depression screen [facility] 2/9 08/17/2021 03/03/2021 11/20/2020 08/21/2020 11/13/2019  Decreased Interest 0 0 0 0 0  Down, Depressed, Hopeless 0 0 0 0 0  PHQ - 2 Score 0 0 0 0 0  Altered sleeping - - - - 0  Tired, decreased energy - - - - 0  Change in appetite - - - - 0  Feeling bad or failure about yourself  - - - - 0  Trouble concentrating - - - - 0  Moving slowly or fidgety/restless - - - - 0  Suicidal thoughts - - - - 0  PHQ-9 Score - - - - 0  Difficult doing work/chores - - - - -    phq 9 is negative   Fall Risk: Fall Risk  08/17/2021 03/03/2021 11/20/2020 08/21/2020 11/13/2019  Falls in the past year? 0 0 0 0 0  Number falls in past yr: - 0 0 0 0  Injury with Fall? - 0 0 0 0  Follow up Falls prevention discussed - - - -      Functional Status Survey: Is the patient deaf or have difficulty hearing?: No Does the  patient have difficulty seeing, even when wearing glasses/contacts?: No Does the patient have difficulty concentrating, remembering, or making decisions?: No Does the patient have difficulty walking or climbing stairs?: No Does the patient have difficulty dressing or bathing?: No Does the patient have difficulty doing errands alone such as visiting a doctor's office or shopping?: No    Assessment & Plan  1. Dyslipidemia associated with type 2 diabetes mellitus (HCC)  - POCT HgB A1C -  Ambulatory referral to Ophthalmology  2. Diabetes mellitus type 2 in obese (HCC)  - POCT HgB A1C - Ambulatory referral to Ophthalmology  3. Morbid obesity (HCC)  Discussed with the patient the risk posed by an increased BMI. Discussed importance of portion control, calorie counting and at least 150 minutes of physical activity weekly. Avoid sweet beverages and drink more water. Eat at least 6 servings of fruit and vegetables daily    4. Palpitation  - EKG 12-Lead - CBC with Differential/Platelet - COMPLETE METABOLIC PANEL WITH GFR - TSH  5. Essential hypertension   6. Periodic heart flutter  - EKG 12-Lead  7. Vitamin D deficiency

## 2021-08-17 ENCOUNTER — Other Ambulatory Visit: Payer: Self-pay

## 2021-08-17 ENCOUNTER — Ambulatory Visit: Payer: BC Managed Care – PPO | Admitting: Family Medicine

## 2021-08-17 ENCOUNTER — Encounter: Payer: Self-pay | Admitting: Family Medicine

## 2021-08-17 VITALS — BP 138/86 | HR 78 | Temp 98.4°F | Resp 18 | Ht 70.0 in | Wt 321.8 lb

## 2021-08-17 DIAGNOSIS — I10 Essential (primary) hypertension: Secondary | ICD-10-CM | POA: Diagnosis not present

## 2021-08-17 DIAGNOSIS — E1169 Type 2 diabetes mellitus with other specified complication: Secondary | ICD-10-CM

## 2021-08-17 DIAGNOSIS — E785 Hyperlipidemia, unspecified: Secondary | ICD-10-CM

## 2021-08-17 DIAGNOSIS — R002 Palpitations: Secondary | ICD-10-CM

## 2021-08-17 DIAGNOSIS — E669 Obesity, unspecified: Secondary | ICD-10-CM

## 2021-08-17 DIAGNOSIS — E559 Vitamin D deficiency, unspecified: Secondary | ICD-10-CM

## 2021-08-17 DIAGNOSIS — I498 Other specified cardiac arrhythmias: Secondary | ICD-10-CM

## 2021-08-17 LAB — POCT GLYCOSYLATED HEMOGLOBIN (HGB A1C): Hemoglobin A1C: 6.4 % — AB (ref 4.0–5.6)

## 2021-08-18 LAB — CBC WITH DIFFERENTIAL/PLATELET
Absolute Monocytes: 432 cells/uL (ref 200–950)
Basophils Absolute: 40 cells/uL (ref 0–200)
Basophils Relative: 0.5 %
Eosinophils Absolute: 112 cells/uL (ref 15–500)
Eosinophils Relative: 1.4 %
HCT: 39.1 % (ref 35.0–45.0)
Hemoglobin: 13.1 g/dL (ref 11.7–15.5)
Lymphs Abs: 1984 cells/uL (ref 850–3900)
MCH: 27.8 pg (ref 27.0–33.0)
MCHC: 33.5 g/dL (ref 32.0–36.0)
MCV: 83 fL (ref 80.0–100.0)
MPV: 10.9 fL (ref 7.5–12.5)
Monocytes Relative: 5.4 %
Neutro Abs: 5432 cells/uL (ref 1500–7800)
Neutrophils Relative %: 67.9 %
Platelets: 248 10*3/uL (ref 140–400)
RBC: 4.71 10*6/uL (ref 3.80–5.10)
RDW: 13.1 % (ref 11.0–15.0)
Total Lymphocyte: 24.8 %
WBC: 8 10*3/uL (ref 3.8–10.8)

## 2021-08-18 LAB — COMPLETE METABOLIC PANEL WITH GFR
AG Ratio: 1.3 (calc) (ref 1.0–2.5)
ALT: 14 U/L (ref 6–29)
AST: 12 U/L (ref 10–30)
Albumin: 4.3 g/dL (ref 3.6–5.1)
Alkaline phosphatase (APISO): 83 U/L (ref 31–125)
BUN: 9 mg/dL (ref 7–25)
CO2: 29 mmol/L (ref 20–32)
Calcium: 9.7 mg/dL (ref 8.6–10.2)
Chloride: 100 mmol/L (ref 98–110)
Creat: 0.66 mg/dL (ref 0.50–0.99)
Globulin: 3.4 g/dL (calc) (ref 1.9–3.7)
Glucose, Bld: 74 mg/dL (ref 65–99)
Potassium: 3.9 mmol/L (ref 3.5–5.3)
Sodium: 136 mmol/L (ref 135–146)
Total Bilirubin: 1 mg/dL (ref 0.2–1.2)
Total Protein: 7.7 g/dL (ref 6.1–8.1)
eGFR: 112 mL/min/{1.73_m2} (ref >=60)

## 2021-08-18 LAB — TSH: TSH: 1.55 mIU/L

## 2021-10-08 ENCOUNTER — Encounter: Payer: Self-pay | Admitting: Nurse Practitioner

## 2021-10-08 ENCOUNTER — Ambulatory Visit (INDEPENDENT_AMBULATORY_CARE_PROVIDER_SITE_OTHER): Payer: BC Managed Care – PPO | Admitting: Nurse Practitioner

## 2021-10-08 ENCOUNTER — Other Ambulatory Visit: Payer: Self-pay

## 2021-10-08 VITALS — BP 130/82 | HR 79 | Temp 98.8°F | Resp 18 | Ht 70.0 in | Wt 325.0 lb

## 2021-10-08 DIAGNOSIS — J069 Acute upper respiratory infection, unspecified: Secondary | ICD-10-CM

## 2021-10-08 DIAGNOSIS — J029 Acute pharyngitis, unspecified: Secondary | ICD-10-CM

## 2021-10-08 LAB — POCT RAPID STREP A (OFFICE): Rapid Strep A Screen: NEGATIVE

## 2021-10-08 NOTE — Progress Notes (Signed)
BP 130/82    Pulse 79    Temp 98.8 F (37.1 C) (Oral)    Resp 18    Ht 5\' 10"  (1.778 m)    Wt (!) 325 lb (147.4 kg)    SpO2 97%    BMI 46.63 kg/m    Subjective:    Patient ID: Bridget Weiss, female    DOB: 07/18/1976, 46 y.o.   MRN: [id number]  HPI: Bridget Weiss is a 46 y.o. female  Chief Complaint  Patient presents with   Sore Throat    For 3 days   URI: She says symptoms started Monday. Daughter was sick last week.  She says symptoms include sore throat, cough and nasal congestion. She denies any shortness of breath or fever.  She has been drinking Hot tea with honey. Taking Coricidin for cough.  She says she did a home covid test and it was negative. Discussed doing a Flu test and Covid PCR she declined.  Did rapid strep test and it was negative.  Discussed OTC treatments for symptoms.  Push fluids and get rest.   Relevant past medical, surgical, family and social history reviewed and updated as indicated. Interim medical history since our last visit reviewed. Allergies and medications reviewed and updated.  Review of Systems  Constitutional: Negative for fever or weight change.  HEENT: positive for nasal congestion and sore throat Respiratory: Positive for cough and negative for shortness of breath.   Cardiovascular: Negative for chest pain or palpitations.  Gastrointestinal: Negative for abdominal pain, no bowel changes.  Musculoskeletal: Negative for gait problem or joint swelling.  Skin: Negative for rash.  Neurological: Negative for dizziness or headache.  No other specific complaints in a complete review of systems (except as listed in HPI above).      Objective:    BP 130/82    Pulse 79    Temp 98.8 F (37.1 C) (Oral)    Resp 18    Ht 5\' 10"  (1.778 m)    Wt (!) 325 lb (147.4 kg)    SpO2 97%    BMI 46.63 kg/m   Wt Readings from Last 3 Encounters:  10/08/21 (!) 325 lb (147.4 kg)  08/17/21 (!) 321 lb 12.8 oz (146 kg)  03/03/21 (!) 327 lb (148.3 kg)    Physical  Exam  Constitutional: Patient appears well-developed and well-nourished. Obese  No distress.  HEENT: head atraumatic, normocephalic, pupils equal and reactive to light, ears TMs clear, neck supple, throat redness and post nasal drip noted, no exudate noted Cardiovascular: Normal rate, regular rhythm and normal heart sounds.  No murmur heard. No BLE edema. Pulmonary/Chest: Effort normal and breath sounds normal. No respiratory distress. Abdominal: Soft.  There is no tenderness. Psychiatric: Patient has a normal mood and affect. behavior is normal. Judgment and thought content normal.   Results for orders placed or performed in visit on 10/08/21  POCT rapid strep A  Result Value Ref Range   Rapid Strep A Screen Negative Negative      Assessment & Plan:   1. Viral upper respiratory tract infection -push fluids -get rest -start zyrtec and flonase -hot tea with honey -gargle with salt water -can do saline nasal wash - POCT rapid strep A (negative)  2. Sore throat -push fluids -get rest -start zyrtec and flonase -hot tea with honey -gargle with salt water -can do saline nasal wash - POCT rapid strep A (negative)   Follow up plan: Return if symptoms worsen  or fail to improve.

## 2021-10-13 ENCOUNTER — Other Ambulatory Visit: Payer: Self-pay | Admitting: Family Medicine

## 2021-10-13 DIAGNOSIS — I10 Essential (primary) hypertension: Secondary | ICD-10-CM

## 2021-10-15 ENCOUNTER — Ambulatory Visit: Payer: BC Managed Care – PPO | Admitting: Nurse Practitioner

## 2021-10-16 ENCOUNTER — Other Ambulatory Visit: Payer: Self-pay | Admitting: Nurse Practitioner

## 2021-10-16 DIAGNOSIS — J029 Acute pharyngitis, unspecified: Secondary | ICD-10-CM

## 2021-10-16 MED ORDER — AZITHROMYCIN 250 MG PO TABS
ORAL_TABLET | ORAL | 0 refills | Status: AC
Start: 2021-10-16 — End: 2021-10-21

## 2021-11-23 NOTE — Progress Notes (Signed)
Name: Bridget Weiss   MRN: [id number]    DOB: 08/25/1976   Date:11/24/2021       Progress Note  Subjective  Chief Complaint  Annual Exam  HPI  Patient presents for annual CPE and discuss palpitation  Palpitation: started after an URI in December, after that she tried to stop taking caffeine , we checked TSH and CBC but symptoms are getting worse. Happens every night now, lasts about 3 minutes when she goes to bed, not associated with diaphoresis, chest pain or nausea, but she feels uncomfortable, denies any increase in stress. She is getting worried about it and would like to find out the cause. We will refer her to cardiologist   Diet: drinks water, avoiding sweets, but she eats starches but less than she used to.  Exercise: discussed 150 minutes per week    Flowsheet Row Office Visit from 10/08/2021 in [facility]  AUDIT-C Score 0      Depression: Phq 9 is  negative Depression screen [facility] 2/9 11/24/2021 10/08/2021 08/17/2021 03/03/2021 11/20/2020  Decreased Interest 0 0 0 0 0  Down, Depressed, Hopeless 0 0 0 0 0  PHQ - 2 Score 0 0 0 0 0  Altered sleeping 0 - - - -  Tired, decreased energy 0 - - - -  Change in appetite 0 - - - -  Feeling bad or failure about yourself  0 - - - -  Trouble concentrating 0 - - - -  Moving slowly or fidgety/restless 0 - - - -  Suicidal thoughts 0 - - - -  PHQ-9 Score 0 - - - -  Difficult doing work/chores - - - - -   Hypertension: BP Readings from Last 3 Encounters:  11/24/21 132/84  10/08/21 130/82  08/17/21 138/86   Obesity: Wt Readings from Last 3 Encounters:  11/24/21 (!) 326 lb (147.9 kg)  10/08/21 (!) 325 lb (147.4 kg)  08/17/21 (!) 321 lb 12.8 oz (146 kg)   BMI Readings from Last 3 Encounters:  11/24/21 46.78 kg/m  10/08/21 46.63 kg/m  08/17/21 46.17 kg/m     Vaccines:   HPV: N/A Tdap: up to date Shingrix: N/A Pneumonia: discussed with patient today  Flu: up to date COVID-19: discussed bivalent vaccine     Hep C Screening: 09/12/18 STD testing and prevention (HIV/chl/gon/syphilis): 11/20/20 Intimate partner violence: negative Sexual History : one partner, married, tubal ligation Menstrual History/LMP/Abnormal Bleeding: started to have lighter than usual cycles and sometimes irregular, LMP last week Discussed perimenopausal  Discussed importance of follow up if any post-menopausal bleeding: not applicable Incontinence Symptoms: No.  Breast cancer:  - Last Mammogram: ordered today  - BRCA gene screening: only mother had breast cancer , discussed BRCA, she states maternal grandmother had cancer but not sure what type ( something with her bowels),   Osteoporosis Prevention : Discussed high calcium and vitamin D supplementation, weight bearing exercises Bone density :not applicable  Cervical cancer screening: 08/21/20  Skin cancer: Discussed monitoring for atypical lesions  Colorectal cancer: N/A   Lung cancer:  Low Dose CT Chest recommended if Age 22-80 years, 20 pack-year currently smoking OR have quit w/in 15years. Patient does not qualify.   ECG: 08/17/21  Advanced Care Planning: A voluntary discussion about advance care planning including the explanation and discussion of advance directives.  Discussed health care proxy and Living will, and the patient was able to identify a health care proxy as husband .  Patient does not  have living will or power of attorney of health care   Lipids: Lab Results  Component Value Date   CHOL 154 11/20/2020   CHOL 148 10/15/2019   CHOL 158 09/12/2018   Lab Results  Component Value Date   HDL 49 (L) 11/20/2020   HDL 48 (L) 10/15/2019   HDL 48 (L) 09/12/2018   Lab Results  Component Value Date   LDLCALC 86 11/20/2020   LDLCALC 78 10/15/2019   LDLCALC 92 09/12/2018   Lab Results  Component Value Date   TRIG 97 11/20/2020   TRIG 119 10/15/2019   TRIG 88 09/12/2018   Lab Results  Component Value Date   CHOLHDL 3.1 11/20/2020   CHOLHDL  3.1 10/15/2019   CHOLHDL 3.3 09/12/2018   No results found for: LDLDIRECT  Glucose: Glucose  Date Value Ref Range Status  02/06/2012 117 (H) 65 - 99 mg/dL Final  16/06/9603 76 65 - 99 mg/dL Final   Glucose, Bld  Date Value Ref Range Status  08/17/2021 74 65 - 99 mg/dL Final    Comment:    .            Fasting reference interval .   11/20/2020 111 (H) 65 - 99 mg/dL Final    Comment:    .            Fasting reference interval . For someone without known diabetes, a glucose value between 100 and 125 mg/dL is consistent with prediabetes and should be confirmed with a follow-up test. .   12/13/2019 121 (H) 65 - 99 mg/dL Final    Comment:    .            Fasting reference interval . For someone without known diabetes, a glucose value between 100 and 125 mg/dL is consistent with prediabetes and should be confirmed with a follow-up test. .     Patient Active Problem List   Diagnosis Date Noted   Hypertension, benign 08/21/2020   Diabetes mellitus type 2 in obese (HCC) 07/10/2015   Family history of breast cancer 07/10/2015   Family history of diabetes mellitus 07/10/2015   Morbid obesity (HCC) 07/10/2015   Vitamin D deficiency 07/10/2015    Past Surgical History:  Procedure Laterality Date   abscess on buttock     had to be drained at the OR   CESAREAN SECTION  06/03/2011   CESAREAN SECTION  2014   CHOLECYSTECTOMY  08/26/2011   PILONIDAL CYST EXCISION      Family History  Problem Relation Age of Onset   Breast cancer Mother        Breast-Diagnosed in 2011 Stage 4   Diabetes Mother    Cancer Maternal Grandmother    Heart failure Paternal Grandmother    Stroke Paternal Grandfather    Diabetes Maternal Aunt    Diabetes Maternal Uncle    Breast cancer Cousin        pat cousin    Social History   Socioeconomic History   Marital status: Married    Spouse name: Antonio    Number of children: 2   Years of education: Not on file   Highest education  level: Not on file  Occupational History   Occupation: Fish farm manager: [employer]  Tobacco Use   Smoking status: Never   Smokeless tobacco: Never  Vaping Use   Vaping Use: Never used  Substance and Sexual Activity   Alcohol use: Yes    Alcohol/week: 0.0  standard drinks    Comment: occassionally   Drug use: No   Sexual activity: Yes    Partners: Male    Birth control/protection: Surgical    Comment: Tubal Ligation  Other Topics Concern   Not on file  Social History Narrative   Re-married, her husband has two children from a previous relationship   They have two children together   Works at [employer] of [employer] Strain: Low Risk    Difficulty of Paying Living Expenses: Not hard at all  Food Insecurity: No Food Insecurity   Worried About Programme researcher, broadcasting/film/video in the Last Year: Never true   Barista in the Last Year: Never true  Transportation Needs: No Transportation Needs   Lack of Transportation (Medical): No   Lack of Transportation (Non-Medical): No  Physical Activity: Insufficiently Active   Days of Exercise per Week: 2 days   Minutes of Exercise per Session: 30 min  Stress: No Stress Concern Present   Feeling of Stress : Not at all  Social Connections: Socially Integrated   Frequency of Communication with Friends and Family: More than three times a week   Frequency of Social Gatherings with Friends and Family: Once a week   Attends Religious Services: More than 4 times per year   Active Member of [employer] or Organizations: Yes   Attends Banker Meetings: 1 to 4 times per year   Marital Status: Married  Catering manager Violence: Not At Risk   Fear of Current or Ex-Partner: No   Emotionally Abused: No   Physically Abused: No   Sexually Abused: No     Current Outpatient Medications:    Cholecalciferol (VITAMIN D) 50 MCG (2000 UT) CAPS, Take 1 capsule by mouth daily., Disp: , Rfl:     valsartan-hydrochlorothiazide (DIOVAN-HCT) 80-12.5 MG tablet, TAKE 1 TABLET BY MOUTH EVERY DAY, Disp: 90 tablet, Rfl: 0  Allergies  Allergen Reactions   Sulfamethoxazole-Trimethoprim Nausea And Vomiting and Other (See Comments)    Headache, Dizziness, Lightlessness, Sweating, Nausea     Diabetic Foot Exam - Simple   Simple Foot Form Visual Inspection No deformities, no ulcerations, no other skin breakdown bilaterally: Yes Sensation Testing Intact to touch and monofilament testing bilaterally: Yes Pulse Check Posterior Tibialis and Dorsalis pulse intact bilaterally: Yes Comments      ROS  Constitutional: Negative for fever or weight change.  Respiratory: Negative for cough and shortness of breath.   Cardiovascular: Negative for chest pain or palpitations.  Gastrointestinal: Negative for abdominal pain, no bowel changes.  Musculoskeletal: Negative for gait problem or joint swelling.  Skin: Negative for rash.  Neurological: Negative for dizziness or headache.  No other specific complaints in a complete review of systems (except as listed in HPI above).   Objective  Vitals:   11/24/21 0921  BP: 132/84  Pulse: 87  Resp: 16  SpO2: 98%  Weight: (!) 326 lb (147.9 kg)  Height: 5\' 10"  (1.778 m)    Body mass index is 46.78 kg/m.  Physical Exam  Constitutional: Patient appears well-developed and well-nourished. No distress.  HENT: Head: Normocephalic and atraumatic. Ears: B TMs ok, no erythema or effusion; Nose: Not done  Mouth/Throat: not done  Eyes: Conjunctivae and EOM are normal. Pupils are equal, round, and reactive to light. No scleral icterus.  Neck: Normal range of motion. Neck supple. No JVD present. No thyromegaly present.  Cardiovascular: Normal rate, regular rhythm and normal  heart sounds.  No murmur heard. No BLE edema. Pulmonary/Chest: Effort normal and breath sounds normal. No respiratory distress. Abdominal: Soft. Bowel sounds are normal, no distension.  There is no tenderness. no masses Breast: no lumps or masses, no nipple discharge or rashes FEMALE GENITALIA:  External genitalia normal External urethra normal Vaginal vault normal without discharge or lesions Cervix normal without discharge or lesions Bimanual exam normal without masses RECTAL: not done  Musculoskeletal: Normal range of motion, no joint effusions. No gross deformities Neurological: he is alert and oriented to person, place, and time. No cranial nerve deficit. Coordination, balance, strength, speech and gait are normal.  Skin: Skin is warm and dry. No rash noted. No erythema.  Psychiatric: Patient has a normal mood and affect. behavior is normal. Judgment and thought content normal.   Recent Results (from the past 2160 hour(s))  POCT rapid strep A     Status: None   Collection Time: 10/08/21  1:16 PM  Result Value Ref Range   Rapid Strep A Screen Negative Negative     Fall Risk: Fall Risk  11/24/2021 10/08/2021 08/17/2021 03/03/2021 11/20/2020  Falls in the past year? 0 0 0 0 0  Number falls in past yr: 0 0 - 0 0  Injury with Fall? 0 0 - 0 0  Risk for fall due to : No Fall Risks - - - -  Follow up Falls prevention discussed Falls evaluation completed Falls prevention discussed - -     Functional Status Survey: Is the patient deaf or have difficulty hearing?: No Does the patient have difficulty seeing, even when wearing glasses/contacts?: No Does the patient have difficulty concentrating, remembering, or making decisions?: No Does the patient have difficulty walking or climbing stairs?: No Does the patient have difficulty dressing or bathing?: No Does the patient have difficulty doing errands alone such as visiting a doctor's office or shopping?: No   Assessment & Plan  1. Well adult exam  - Cytology - PAP - Lipid panel - Microalbumin / creatinine urine ratio - COMPLETE METABOLIC PANEL WITH GFR - Hemoglobin A1c - MM 3D SCREEN BREAST BILATERAL;  Future  2. ASCUS of cervix with negative high risk HPV  - Cytology - PAP  3. Dyslipidemia associated with type 2 diabetes mellitus (HCC)  - Lipid panel - Microalbumin / creatinine urine ratio - COMPLETE METABOLIC PANEL WITH GFR - Hemoglobin A1c  4. Encounter for screening mammogram for malignant neoplasm of breast  - MM 3D SCREEN BREAST BILATERAL; Future  5. Need for vaccination for pneumococcus  refused   6. Palpitation  - Ambulatory referral to Cardiology   -USPSTF grade A and B recommendations reviewed with patient; age-appropriate recommendations, preventive care, screening tests, etc discussed and encouraged; healthy living encouraged; see AVS for patient education given to patient -Discussed importance of 150 minutes of physical activity weekly, eat two servings of fish weekly, eat one serving of tree nuts ( cashews, pistachios, pecans, almonds.Marland Kitchen) every other day, eat 6 servings of fruit/vegetables daily and drink plenty of water and avoid sweet beverages.   -Reviewed Health Maintenance: Yes.

## 2021-11-23 NOTE — Patient Instructions (Signed)

## 2021-11-24 ENCOUNTER — Other Ambulatory Visit (HOSPITAL_COMMUNITY)
Admission: RE | Admit: 2021-11-24 | Discharge: 2021-11-24 | Disposition: A | Discharge status: Home / Self Care | Payer: BC Managed Care – PPO | Source: Ambulatory Visit | Attending: Family Medicine | Admitting: Family Medicine

## 2021-11-24 ENCOUNTER — Encounter: Payer: Self-pay | Admitting: Family Medicine

## 2021-11-24 ENCOUNTER — Ambulatory Visit (INDEPENDENT_AMBULATORY_CARE_PROVIDER_SITE_OTHER): Payer: BC Managed Care – PPO | Admitting: Family Medicine

## 2021-11-24 VITALS — BP 132/84 | HR 87 | Resp 16 | Ht 70.0 in | Wt 326.0 lb

## 2021-11-24 DIAGNOSIS — R87610 Atypical squamous cells of undetermined significance on cytologic smear of cervix (ASC-US): Secondary | ICD-10-CM | POA: Insufficient documentation

## 2021-11-24 DIAGNOSIS — Z1231 Encounter for screening mammogram for malignant neoplasm of breast: Secondary | ICD-10-CM | POA: Diagnosis not present

## 2021-11-24 DIAGNOSIS — E1169 Type 2 diabetes mellitus with other specified complication: Secondary | ICD-10-CM

## 2021-11-24 DIAGNOSIS — Z0000 Encounter for general adult medical examination without abnormal findings: Secondary | ICD-10-CM | POA: Insufficient documentation

## 2021-11-24 DIAGNOSIS — Z23 Encounter for immunization: Secondary | ICD-10-CM

## 2021-11-24 DIAGNOSIS — E785 Hyperlipidemia, unspecified: Secondary | ICD-10-CM

## 2021-11-24 DIAGNOSIS — R002 Palpitations: Secondary | ICD-10-CM

## 2021-11-25 LAB — MICROALBUMIN / CREATININE URINE RATIO
Creatinine, Urine: 76 mg/dL (ref 20–275)
Microalb, Ur: <0.2 mg/dL

## 2021-11-26 LAB — CYTOLOGY - PAP
Chlamydia: NEGATIVE
Comment: NEGATIVE
Comment: NEGATIVE
Comment: NORMAL
Diagnosis: NEGATIVE
Diagnosis: REACTIVE
High risk HPV: NEGATIVE
Neisseria Gonorrhea: NEGATIVE

## 2021-12-12 LAB — COMPLETE METABOLIC PANEL WITH GFR
AG Ratio: 1.3 (calc) (ref 1.0–2.5)
ALT: 17 U/L (ref 6–29)
AST: 17 U/L (ref 10–30)
Albumin: 4.4 g/dL (ref 3.6–5.1)
Alkaline phosphatase (APISO): 79 U/L (ref 31–125)
BUN/Creatinine Ratio: 9 (calc) (ref 6–22)
BUN: 6 mg/dL — ABNORMAL LOW (ref 7–25)
CO2: 26 mmol/L (ref 20–32)
Calcium: 9.6 mg/dL (ref 8.6–10.2)
Chloride: 101 mmol/L (ref 98–110)
Creat: 0.65 mg/dL (ref 0.50–0.99)
Globulin: 3.5 g/dL (calc) (ref 1.9–3.7)
Glucose, Bld: 118 mg/dL — ABNORMAL HIGH (ref 65–99)
Potassium: 3.9 mmol/L (ref 3.5–5.3)
Sodium: 137 mmol/L (ref 135–146)
Total Bilirubin: 1.4 mg/dL — ABNORMAL HIGH (ref 0.2–1.2)
Total Protein: 7.9 g/dL (ref 6.1–8.1)
eGFR: 111 mL/min/{1.73_m2} (ref >=60)

## 2021-12-12 LAB — LIPID PANEL
Cholesterol: 153 mg/dL (ref <200)
HDL: 53 mg/dL (ref >=50)
LDL Cholesterol (Calc): 82 mg/dL (calc)
Non-HDL Cholesterol (Calc): 100 mg/dL (calc) (ref <130)
Total CHOL/HDL Ratio: 2.9 (calc) (ref <5.0)
Triglycerides: 86 mg/dL (ref <150)

## 2021-12-12 LAB — HEMOGLOBIN A1C
Hgb A1c MFr Bld: 7.3 % of total Hgb — ABNORMAL HIGH (ref <5.7)
Mean Plasma Glucose: 163 mg/dL
eAG (mmol/L): 9 mmol/L

## 2022-01-06 NOTE — Progress Notes (Signed)
Cardiology Office Note ? ?Date:  01/08/2022  ? ?ID:  Bridget Weiss, DOB 1975/12/12, MRN [id number] ? ?PCP:  Steele Sizer, MD  ? ?Chief Complaint  ?Patient presents with  ? New Patient (Initial Visit)  ?  Ref by Dr. Ancil Boozer for palpitations. Medications reviewed by the patient verbally.   ? ? ?HPI:  ?Bridget Weiss is a 46 year old woman with past medical history of ?Hypertension ?Diabetes type 2 ?Morbid obesity ?Who presents by referral from Dr. Ancil Boozer for palpitations ? ?Seen by primary care November 24, 2021 ?Palpitation: started after an URI in nov/December, after that she tried to stop taking caffeine ,  ?Lab work done through primary care were essentially normal including TSH and CBC  ?Seems to happen every night ? ?Lasting up to 10-15 sec, runs at night, every night for 2 months ?Tried sleeping sitting up, little better ?Sometimes in the day ?No pain, "flutters/racing" ? ?EKG personally reviewed by myself on todays visit ?Normal sinus rhythm rate 73 bpm PVC noted no significant ST or T wave changes ? ?PMH:   has a past medical history of Abnormal fasting glucose, Family history of breast cancer, Family history of diabetes mellitus, Intermittent low back pain, Obesity, Obesity, morbid ([place]), and Vitamin D deficiency. ? ?PSH:    ?Past Surgical History:  ?Procedure Laterality Date  ? abscess on buttock    ? had to be drained at the OR  ? CESAREAN SECTION  06/03/2011  ? CESAREAN SECTION  2014  ? CHOLECYSTECTOMY  08/26/2011  ? PILONIDAL CYST EXCISION    ? ? ?Current Outpatient Medications  ?Medication Sig Dispense Refill  ? Cholecalciferol (VITAMIN D) 50 MCG (2000 UT) CAPS Take 1 capsule by mouth daily.    ? valsartan-hydrochlorothiazide (DIOVAN-HCT) 80-12.5 MG tablet TAKE 1 TABLET BY MOUTH EVERY DAY 90 tablet 0  ? ?No current facility-administered medications for this visit.  ? ? ?Allergies:   Sulfamethoxazole-trimethoprim  ? ?Social History:  The patient  reports that she has never smoked. She has never used smokeless  tobacco. She reports current alcohol use. She reports that she does not use drugs.  ? ?Family History:   family history includes Breast cancer in her cousin and mother; Cancer in her maternal grandmother; Diabetes in her maternal aunt, maternal uncle, and mother; Heart failure in her paternal grandmother; Stroke in her paternal grandfather.  ? ? ?Review of Systems: ?Review of Systems  ?Constitutional: Negative.   ?HENT: Negative.    ?Respiratory: Negative.    ?Cardiovascular: Negative.   ?Gastrointestinal: Negative.   ?Musculoskeletal: Negative.   ?Neurological: Negative.   ?Psychiatric/Behavioral: Negative.    ?All other systems reviewed and are negative. ? ? ?PHYSICAL EXAM: ?VS:  BP 118/84 (BP Location: Right Arm, Patient Position: Sitting, Cuff Size: Large)   Pulse 73   Ht 5\' 10"  (1.778 m)   Wt (!) 327 lb 8 oz (148.6 kg)   SpO2 98%   BMI 46.99 kg/m?  , BMI Body mass index is 46.99 kg/m?. ?GEN: Well nourished, well developed, in no acute distress ?HEENT: normal ?Neck: no JVD, carotid bruits, or masses ?Cardiac: RRR; no murmurs, rubs, or gallops,no edema  ?Respiratory:  clear to auscultation bilaterally, normal work of breathing ?GI: soft, nontender, nondistended, + BS ?MS: no deformity or atrophy ?Skin: warm and dry, no rash ?Neuro:  Strength and sensation are intact ?Psych: euthymic mood, full affect ? ? ?Recent Labs: ?08/17/2021: Hemoglobin 13.1; Platelets 248; TSH 1.55 ?12/11/2021: ALT 17; BUN 6; Creat 0.65; Potassium 3.9;  Sodium 137  ? ? ?Lipid Panel ?Lab Results  ?Component Value Date  ? CHOL 153 12/11/2021  ? HDL 53 12/11/2021  ? [place]ianza 82 12/11/2021  ? TRIG 86 12/11/2021  ? ?  ? ?Wt Readings from Last 3 Encounters:  ?01/08/22 (!) 327 lb 8 oz (148.6 kg)  ?11/24/21 (!) 326 lb (147.9 kg)  ?10/08/21 (!) 325 lb (147.4 kg)  ?  ? ? ?ASSESSMENT AND PLAN: ? ?Problem List Items Addressed This Visit   ? ?  ? Cardiology Problems  ? Hypertension, benign  ?  ? Other  ? Diabetes mellitus type 2 in obese [facility]) -  Primary  ? Morbid obesity ([place])  ? ?Other Visit Diagnoses   ? ? Palpitations      ? ?  ? ?Paroxysmal tachycardia/palpitations ?Dating back to around November or December 2022 following URI symptoms ?Seems to have persisted, almost nightly sometimes during the daytime ?Lasting short amount of time up to 15 seconds relieved sometimes by coughing, change in position ?Discussed various types of cardiac arrhythmia.  PVC noted on EKG today ?We have ordered a ZIO monitor for further evaluation ?We will call her with the results ?TSH in normal range ?Discussed potential need for beta-blockers depending on monitor results ? ?Diabetes type 2 in morbid obesity ?We have encouraged continued exercise, careful diet management in an effort to lose weight. ?Discussed various medication options for weight loss ? ?Essential hypertension ?Blood pressure is well controlled on today's visit. No changes made to the medications. ? ? ? Total encounter time more than 50 minutes ? Greater than 50% was spent in counseling and coordination of care with the patient ? ? ? ?Signed, ?Esmond Plants, M.D., Ph.D. ?[facility] [place], [place] ?[phone] ?

## 2022-01-08 ENCOUNTER — Ambulatory Visit: Payer: BC Managed Care – PPO | Admitting: Cardiovascular Disease

## 2022-01-08 ENCOUNTER — Encounter: Payer: Self-pay | Admitting: Cardiovascular Disease

## 2022-01-08 ENCOUNTER — Ambulatory Visit (INDEPENDENT_AMBULATORY_CARE_PROVIDER_SITE_OTHER): Payer: BC Managed Care – PPO

## 2022-01-08 VITALS — BP 118/84 | HR 73 | Ht 70.0 in | Wt 327.5 lb

## 2022-01-08 DIAGNOSIS — I10 Essential (primary) hypertension: Secondary | ICD-10-CM

## 2022-01-08 DIAGNOSIS — I479 Paroxysmal tachycardia, unspecified: Secondary | ICD-10-CM | POA: Diagnosis not present

## 2022-01-08 DIAGNOSIS — R002 Palpitations: Secondary | ICD-10-CM | POA: Diagnosis not present

## 2022-01-08 DIAGNOSIS — E1169 Type 2 diabetes mellitus with other specified complication: Secondary | ICD-10-CM | POA: Diagnosis not present

## 2022-01-08 DIAGNOSIS — E669 Obesity, unspecified: Secondary | ICD-10-CM

## 2022-01-08 NOTE — Patient Instructions (Addendum)
Medication Instructions:  No changes  If you need a refill on your cardiac medications before your next appointment, please call your pharmacy.   Lab work: No new labs needed  Testing/Procedures:  Your provider has ordered a heart monitor to wear for 14 days. This will be mailed to your home with instructions on placement. Once you have finished the time frame requested, you will return monitor in box provided.     Follow-Up: At [facility], you and your health needs are our priority.  As part of our continuing mission to provide you with exceptional heart care, we have created designated Provider Care Teams.  These Care Teams include your primary Cardiologist (physician) and Advanced Practice Providers (APPs -  Physician Assistants and Nurse Practitioners) who all work together to provide you with the care you need, when you need it.  You will need a follow up appointment as needed  Providers on your designated Care Team:   Christopher Berge, NP Ryan Dunn, PA-C Cadence Furth, PA-C  COVID-19 Vaccine Information can be found at: [email or web address] For questions related to vaccine distribution or appointments, please email [email or web address] or call [phone].   

## 2022-01-12 ENCOUNTER — Other Ambulatory Visit: Payer: Self-pay | Admitting: Family Medicine

## 2022-01-12 DIAGNOSIS — I10 Essential (primary) hypertension: Secondary | ICD-10-CM

## 2022-02-08 ENCOUNTER — Telehealth: Payer: Self-pay | Admitting: Cardiovascular Disease

## 2022-02-08 NOTE — Telephone Encounter (Signed)
Pt calling to f/u on Heart Monitor results. Please advise 

## 2022-02-08 NOTE — Telephone Encounter (Signed)
Called patient.   Advised patient that monitor results dropped into MDs basket this morning and he should read them in the next few days.   Patient verbalized understanding and voiced appreciation for the call.

## 2022-02-09 ENCOUNTER — Encounter: Payer: Self-pay | Admitting: Cardiovascular Disease

## 2022-02-09 NOTE — Telephone Encounter (Signed)
See 6/05 phone encounter as well. Patient is following up, again requesting to review monitor results.

## 2022-02-12 ENCOUNTER — Telehealth: Payer: Self-pay | Admitting: Emergency Medicine

## 2022-02-12 NOTE — Telephone Encounter (Signed)
Called and spoke with patient. Reviewed results and recommendations.   Patient declines taking beta blocker at this time, understands that she can reach out to our office at any time if she changes her mind.

## 2022-02-12 NOTE — Telephone Encounter (Signed)
-----   Message from Minna Merritts, MD sent at 02/11/2022  5:56 PM EDT ----- Rare short episodes of tachycardia noted that would likely explain the short runs of palpitations she does appreciate in the evenings Many of the triggered events associated with normal rhythm with extra beats Some triggered events associated with the short runs of tachycardia, longest run was 8 seconds Treatment may include adding a beta-blocker Such as either metoprolol tartrate 25 mg twice a day or the metoprolol succinate 25 mg at dinnertime If symptoms only late at night could try the metoprolol to tartrate 25 mg at dinner with no pill in the morning

## 2022-03-01 NOTE — Progress Notes (Deleted)
Name: Bridget Weiss   MRN: [id number]    DOB: Jul 31, 1976   Date:03/01/2022       Progress Note  Subjective  Chief Complaint  Follow Up  HPI  HTN:she is on  diovan/hctz 80/12.5 mg daily, her bp has been at goal at home 130/80 , a little higher in our office but she does not want to increase the dose at this time.   Vitamin D deficiency:she is taking otc 2000 units daily .   DMII: diagnosed she has associated obesity,hyperlipidemia and  HTN. She is taking Diovan hctz 80/12.5 mg daily. A1C is again 6.4%  She has been making healthier choices, she has not as active over the past few weeks.   Urine micro is up to date, discussed adding stating therapy but she is not interested. Discussed diabetic medications that can help with weight loss but she is not interested at this time. She denies polyphagia, polydipsia or polyuria.   Morbid obesity: BMI above 40 , weight is stable, she is avoiding fast food, she was working out 3-4 times per week but stopped after flu like illness in Nov followed by palpitation   Abnormal pap smear: last one was done 08/2020 and ASCUS negative for HPV  Dyslipidemia: she refuses statin therapy at this time, LDL went up from 78 to 86 HDL low  at 49, she has been eating almonds occasionally   Palpitation: she states she had flu like symptoms early Nov and had to get the flu shot on Nov 18 th for work, the following week she noticed palpitation even at rest , lasting a few seconds and resolved with deep inspiration.. She states episodes are present most days of the week usually once a day. Sometimes the palpitation makes her cough No SOB or diaphoresis   Patient Active Problem List   Diagnosis Date Noted   Hypertension, benign 08/21/2020   Diabetes mellitus type 2 in obese ([place]) 07/10/2015   Family history of breast cancer 07/10/2015   Family history of diabetes mellitus 07/10/2015   Morbid obesity ([place]) 07/10/2015   Vitamin D deficiency 07/10/2015    Past Surgical  History:  Procedure Laterality Date   abscess on buttock     had to be drained at the [facility]  06/03/2011   CESAREAN SECTION  2014   CHOLECYSTECTOMY  08/26/2011   PILONIDAL CYST EXCISION      Family History  Problem Relation Age of Onset   Breast cancer Mother        Breast-Diagnosed in 2011 Stage 4   Diabetes Mother    Cancer Maternal Grandmother    Heart failure Paternal Grandmother    Stroke Paternal Grandfather    Diabetes Maternal Aunt    Diabetes Maternal Uncle    Breast cancer Cousin        pat cousin    Social History   Tobacco Use   Smoking status: Never   Smokeless tobacco: Never  Substance Use Topics   Alcohol use: Yes    Alcohol/week: 0.0 standard drinks of alcohol    Comment: occassionally     Current Outpatient Medications:    Cholecalciferol (VITAMIN D) 50 MCG (2000 UT) CAPS, Take 1 capsule by mouth daily., Disp: , Rfl:    valsartan-hydrochlorothiazide (DIOVAN-HCT) 80-12.5 MG tablet, TAKE 1 TABLET BY MOUTH EVERY DAY, Disp: 90 tablet, Rfl: 0  Allergies  Allergen Reactions   Sulfamethoxazole-Trimethoprim Nausea And Vomiting and Other (See Comments)    Headache, Dizziness,  Lightlessness, Sweating, Nausea      I personally reviewed active problem list, medication list, allergies, family history, social history, health maintenance with the patient/caregiver today.   ROS  ***  Objective  There were no vitals filed for this visit.  There is no height or weight on file to calculate BMI.  Physical Exam ***  Recent Results (from the past 2160 hour(s))  Lipid panel     Status: None   Collection Time: 12/11/21  8:58 AM  Result Value Ref Range   Cholesterol 153 <200 mg/dL   HDL 53 > OR = 50 mg/dL   Triglycerides 86 <150 mg/dL   LDL Cholesterol (Calc) 82 mg/dL (calc)    Comment: Reference range: <100 . Desirable range <100 mg/dL for primary prevention;   <70 mg/dL for patients with CHD or diabetic patients  with > or = 2 CHD  risk factors. Marland Kitchen LDL-C is now calculated using the Martin-Hopkins  calculation, which is a validated novel method providing  better accuracy than the Friedewald equation in the  estimation of LDL-C.  Cresenciano Genre et al. Annamaria Helling. 1751;025(85): 2061-2068  (http://education.[facility].com/faq/FAQ164)    Total CHOL/HDL Ratio 2.9 <5.0 (calc)   Non-HDL Cholesterol (Calc) 100 <130 mg/dL (calc)    Comment: For patients with diabetes plus 1 major ASCVD risk  factor, treating to a non-HDL-C goal of <100 mg/dL  (LDL-C of <70 mg/dL) is considered a therapeutic  option.   COMPLETE METABOLIC PANEL WITH GFR     Status: Abnormal   Collection Time: 12/11/21  8:58 AM  Result Value Ref Range   Glucose, Bld 118 (H) 65 - 99 mg/dL    Comment: .            Fasting reference interval . For someone without known diabetes, a glucose value between 100 and 125 mg/dL is consistent with prediabetes and should be confirmed with a follow-up test. .    BUN 6 (L) 7 - 25 mg/dL   Creat 0.65 0.50 - 0.99 mg/dL   eGFR 111 > OR = 60 mL/min/1.44m    Comment: The eGFR is based on the CKD-EPI 2021 equation. To calculate  the new eGFR from a previous Creatinine or Cystatin C result, go to https://www.kidney.org/professionals/ kdoqi/gfr%5Fcalculator    BUN/Creatinine Ratio 9 6 - 22 (calc)   Sodium 137 135 - 146 mmol/L   Potassium 3.9 3.5 - 5.3 mmol/L   Chloride 101 98 - 110 mmol/L   CO2 26 20 - 32 mmol/L   Calcium 9.6 8.6 - 10.2 mg/dL   Total Protein 7.9 6.1 - 8.1 g/dL   Albumin 4.4 3.6 - 5.1 g/dL   Globulin 3.5 1.9 - 3.7 g/dL (calc)   AG Ratio 1.3 1.0 - 2.5 (calc)   Total Bilirubin 1.4 (H) 0.2 - 1.2 mg/dL   Alkaline phosphatase (APISO) 79 31 - 125 U/L   AST 17 10 - 30 U/L   ALT 17 6 - 29 U/L  Hemoglobin A1c     Status: Abnormal   Collection Time: 12/11/21  8:58 AM  Result Value Ref Range   Hgb A1c MFr Bld 7.3 (H) <5.7 % of total Hgb    Comment: For someone without known diabetes, a hemoglobin A1c value  of 6.5% or greater indicates that they may have  diabetes and this should be confirmed with a follow-up  test. . For someone with known diabetes, a value <7% indicates  that their diabetes is well controlled and a value  greater than  or equal to 7% indicates suboptimal  control. A1c targets should be individualized based on  duration of diabetes, age, comorbid conditions, and  other considerations. . Currently, no consensus exists regarding use of hemoglobin A1c for diagnosis of diabetes for children. .    Mean Plasma Glucose 163 mg/dL   eAG (mmol/L) 9.0 mmol/L    Diabetic Foot Exam: Diabetic Foot Exam - Simple   No data filed    ***  PHQ2/9:    11/24/2021    9:21 AM 10/08/2021    1:14 PM 08/17/2021    2:15 PM 03/03/2021    2:44 PM 11/20/2020    7:40 AM  Depression screen PHQ 2/9  Decreased Interest 0 0 0 0 0  Down, Depressed, Hopeless 0 0 0 0 0  PHQ - 2 Score 0 0 0 0 0  Altered sleeping 0      Tired, decreased energy 0      Change in appetite 0      Feeling bad or failure about yourself  0      Trouble concentrating 0      Moving slowly or fidgety/restless 0      Suicidal thoughts 0      PHQ-9 Score 0        phq 9 is {gen pos PNT:614431}   Fall Risk:    11/24/2021    9:20 AM 10/08/2021    1:14 PM 08/17/2021    2:15 PM 03/03/2021    2:44 PM 11/20/2020    7:40 AM  Fall Risk   Falls in the past year? 0 0 0 0 0  Number falls in past yr: 0 0  0 0  Injury with Fall? 0 0  0 0  Risk for fall due to : No Fall Risks      Follow up Falls prevention discussed Falls evaluation completed Falls prevention discussed        Functional Status Survey:      Assessment & Plan  *** There are no diagnoses linked to this encounter.

## 2022-03-02 ENCOUNTER — Ambulatory Visit: Payer: BC Managed Care – PPO | Admitting: Family Medicine

## 2022-04-14 ENCOUNTER — Ambulatory Visit: Payer: BC Managed Care – PPO | Admitting: Family Medicine

## 2022-05-17 ENCOUNTER — Other Ambulatory Visit: Payer: Self-pay | Admitting: Family Medicine

## 2022-05-17 DIAGNOSIS — I10 Essential (primary) hypertension: Secondary | ICD-10-CM

## 2022-05-25 NOTE — Progress Notes (Deleted)
Name: Bridget Weiss   MRN: [id number]    DOB: 11/01/1975   Date:05/25/2022       Progress Note  Subjective  Chief Complaint  Follow Up  HPI  HTN:she is on  diovan/hctz 80/12.5 mg daily, her bp has been at goal at home 130/80 , a little higher in our office but she does not want to increase the dose at this time.   Vitamin D deficiency:she is taking otc 2000 units daily .   DMII: diagnosed she has associated obesity,hyperlipidemia and  HTN. She is taking Diovan hctz 80/12.5 mg daily. A1C is again 6.4%  She has been making healthier choices, she has not as active over the past few weeks.   Urine micro is up to date, discussed adding stating therapy but she is not interested. Discussed diabetic medications that can help with weight loss but she is not interested at this time. She denies polyphagia, polydipsia or polyuria.   Morbid obesity: BMI above 40 , weight is stable, she is avoiding fast food, she was working out 3-4 times per week but stopped after flu like illness in Nov followed by palpitation   Abnormal pap smear: last one was done 08/2020 and ASCUS negative for HPV  Dyslipidemia: she refuses statin therapy at this time, LDL went up from 78 to 86 HDL low  at 49, she has been eating almonds occasionally   Palpitation: started after an URI in December, after that she tried to stop taking caffeine , we checked TSH and CBC but symptoms are getting worse. Happens every night now, lasts about 3 minutes when she goes to bed, not associated with diaphoresis, chest pain or nausea, but she feels uncomfortable, denies any increase in stress. She is getting worried about it and would like to find out the cause. We will refer her to cardiologist   Patient Active Problem List   Diagnosis Date Noted   Hypertension, benign 08/21/2020   Diabetes mellitus type 2 in obese ([place]) 07/10/2015   Family history of breast cancer 07/10/2015   Family history of diabetes mellitus 07/10/2015   Morbid obesity  ([place]) 07/10/2015   Vitamin D deficiency 07/10/2015    Past Surgical History:  Procedure Laterality Date   abscess on buttock     had to be drained at the [facility]  06/03/2011   CESAREAN SECTION  2014   CHOLECYSTECTOMY  08/26/2011   PILONIDAL CYST EXCISION      Family History  Problem Relation Age of Onset   Breast cancer Mother        Breast-Diagnosed in 2011 Stage 4   Diabetes Mother    Cancer Maternal Grandmother    Heart failure Paternal Grandmother    Stroke Paternal Grandfather    Diabetes Maternal Aunt    Diabetes Maternal Uncle    Breast cancer Cousin        pat cousin    Social History   Tobacco Use   Smoking status: Never   Smokeless tobacco: Never  Substance Use Topics   Alcohol use: Yes    Alcohol/week: 0.0 standard drinks of alcohol    Comment: occassionally     Current Outpatient Medications:    Cholecalciferol (VITAMIN D) 50 MCG (2000 UT) CAPS, Take 1 capsule by mouth daily., Disp: , Rfl:    valsartan-hydrochlorothiazide (DIOVAN-HCT) 80-12.5 MG tablet, TAKE 1 TABLET BY MOUTH EVERY DAY, Disp: 90 tablet, Rfl: 0  Allergies  Allergen Reactions   Sulfamethoxazole-Trimethoprim Nausea And  Vomiting and Other (See Comments)    Headache, Dizziness, Lightlessness, Sweating, Nausea      I personally reviewed active problem list, medication list, allergies, family history, social history, health maintenance with the patient/caregiver today.   ROS  ***  Objective  There were no vitals filed for this visit.  There is no height or weight on file to calculate BMI.  Physical Exam ***  No results found for this or any previous visit (from the past 2160 hour(s)).  Diabetic Foot Exam: Diabetic Foot Exam - Simple   No data filed    ***  PHQ2/9:    11/24/2021    9:21 AM 10/08/2021    1:14 PM 08/17/2021    2:15 PM 03/03/2021    2:44 PM 11/20/2020    7:40 AM  Depression screen PHQ 2/9  Decreased Interest 0 0 0 0 0  Down, Depressed,  Hopeless 0 0 0 0 0  PHQ - 2 Score 0 0 0 0 0  Altered sleeping 0      Tired, decreased energy 0      Change in appetite 0      Feeling bad or failure about yourself  0      Trouble concentrating 0      Moving slowly or fidgety/restless 0      Suicidal thoughts 0      PHQ-9 Score 0        phq 9 is {gen pos MGQ:676195}   Fall Risk:    11/24/2021    9:20 AM 10/08/2021    1:14 PM 08/17/2021    2:15 PM 03/03/2021    2:44 PM 11/20/2020    7:40 AM  Fall Risk   Falls in the past year? 0 0 0 0 0  Number falls in past yr: 0 0  0 0  Injury with Fall? 0 0  0 0  Risk for fall due to : No Fall Risks      Follow up Falls prevention discussed Falls evaluation completed Falls prevention discussed        Functional Status Survey:      Assessment & Plan  *** There are no diagnoses linked to this encounter.

## 2022-05-26 ENCOUNTER — Ambulatory Visit: Payer: BC Managed Care – PPO | Admitting: Family Medicine

## 2022-05-26 DIAGNOSIS — E1169 Type 2 diabetes mellitus with other specified complication: Secondary | ICD-10-CM

## 2022-08-12 ENCOUNTER — Other Ambulatory Visit: Payer: Self-pay | Admitting: Family Medicine

## 2022-08-12 DIAGNOSIS — I10 Essential (primary) hypertension: Secondary | ICD-10-CM

## 2022-10-05 IMAGING — MG DIGITAL SCREENING BILAT W/ TOMO W/ CAD
8 series · 8 of 24 positions shown · non-contrast
Comparison: None.

CLINICAL DATA: Screening.

EXAM:
DIGITAL SCREENING BILATERAL MAMMOGRAM WITH TOMO AND CAD

[R MLO synth-2D]
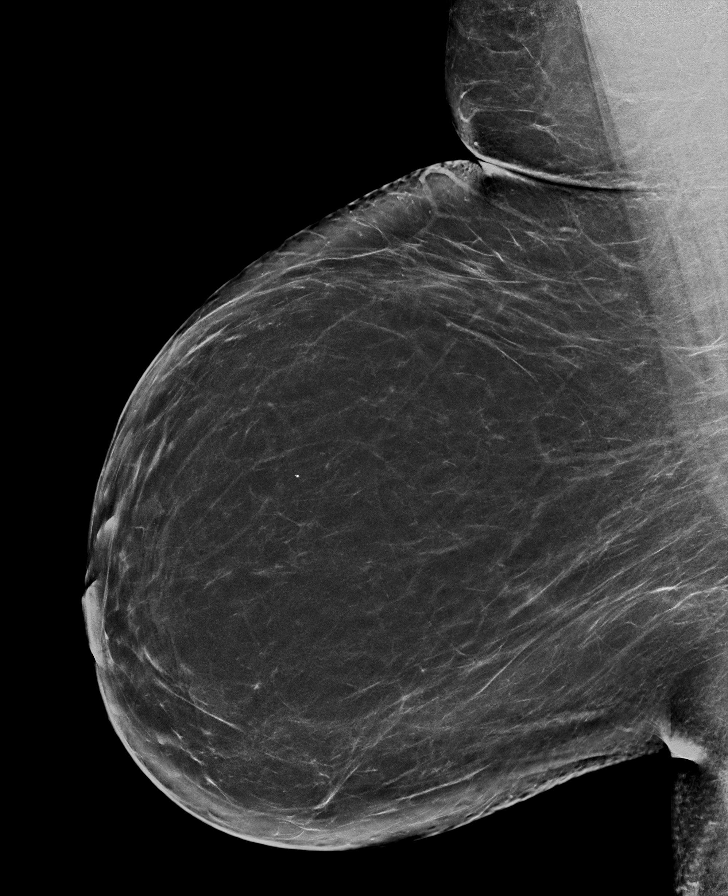

[L CC synth-2D]
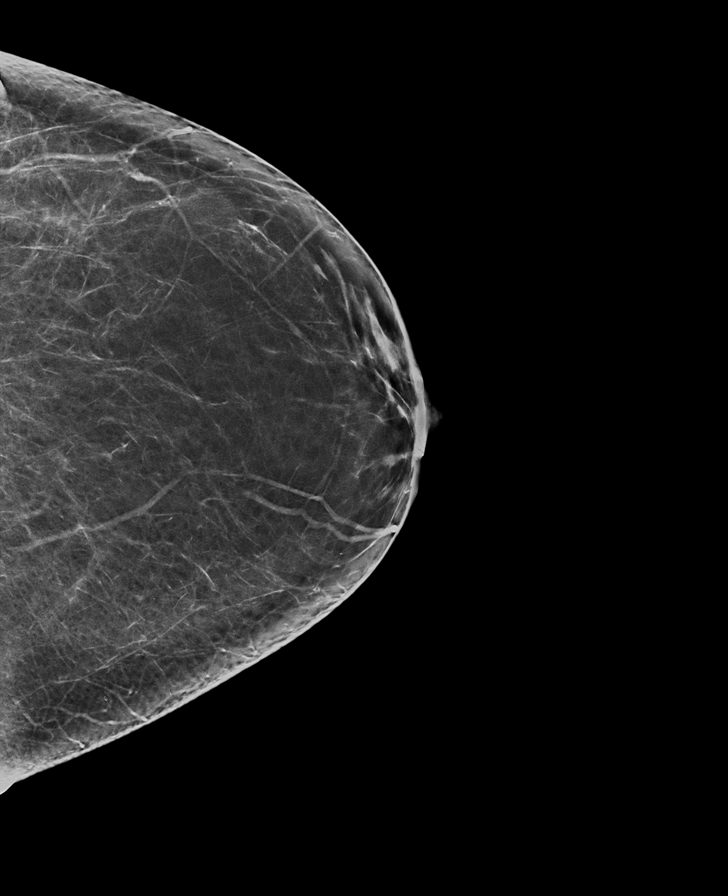

[R CC synth-2D]
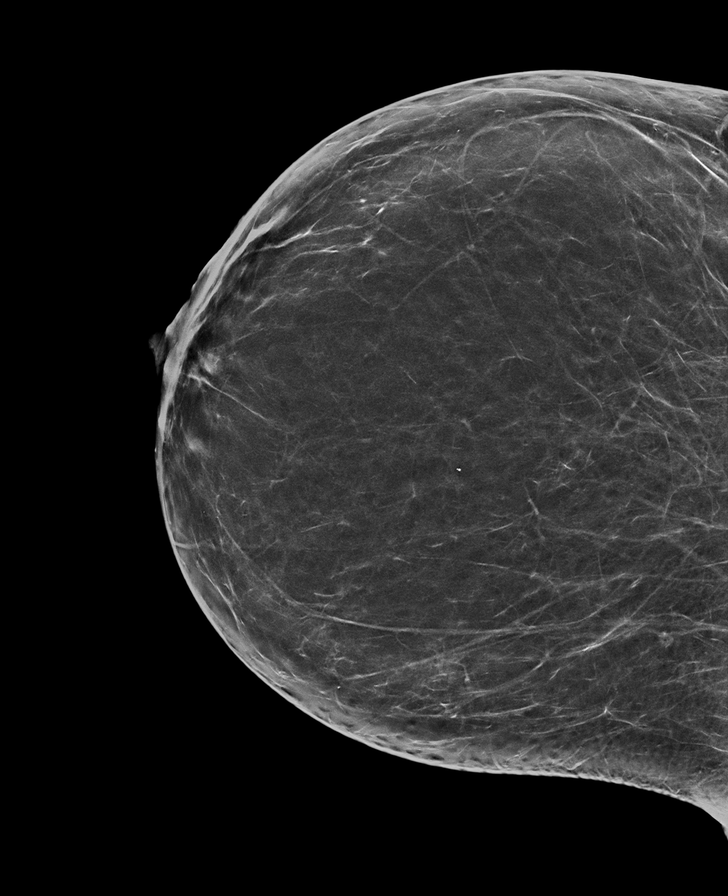

[L MLO synth-2D]
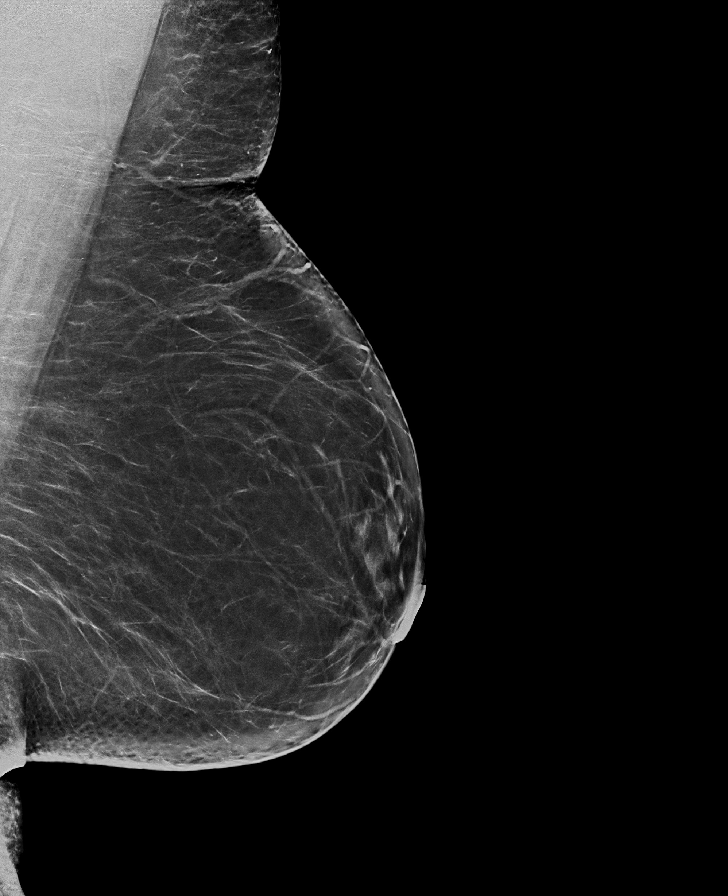

[R CC tomo · tomo slice 39/77.0]
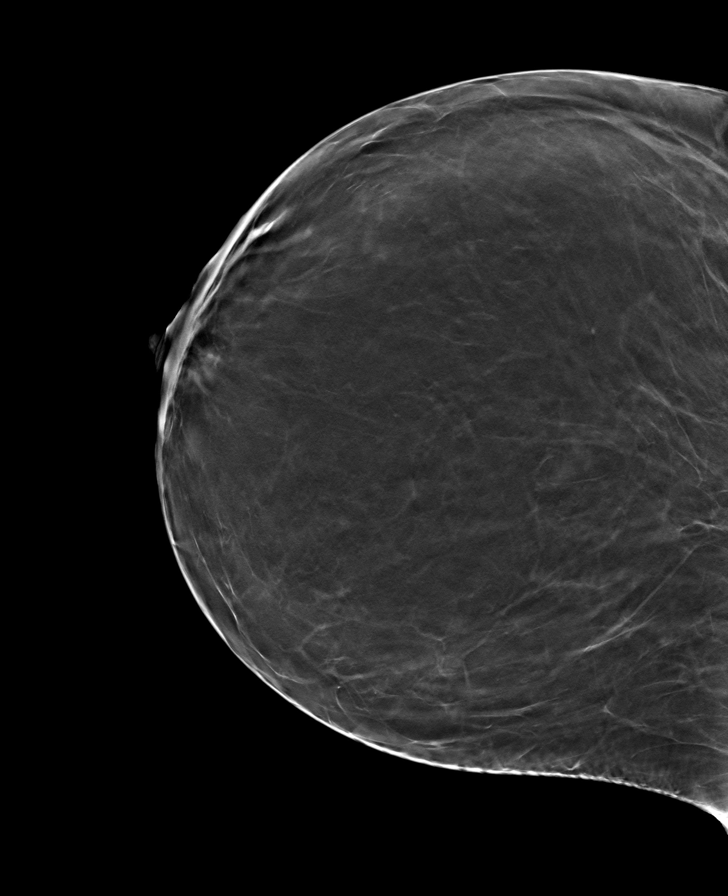

[R MLO tomo · tomo slice 47/94.0]
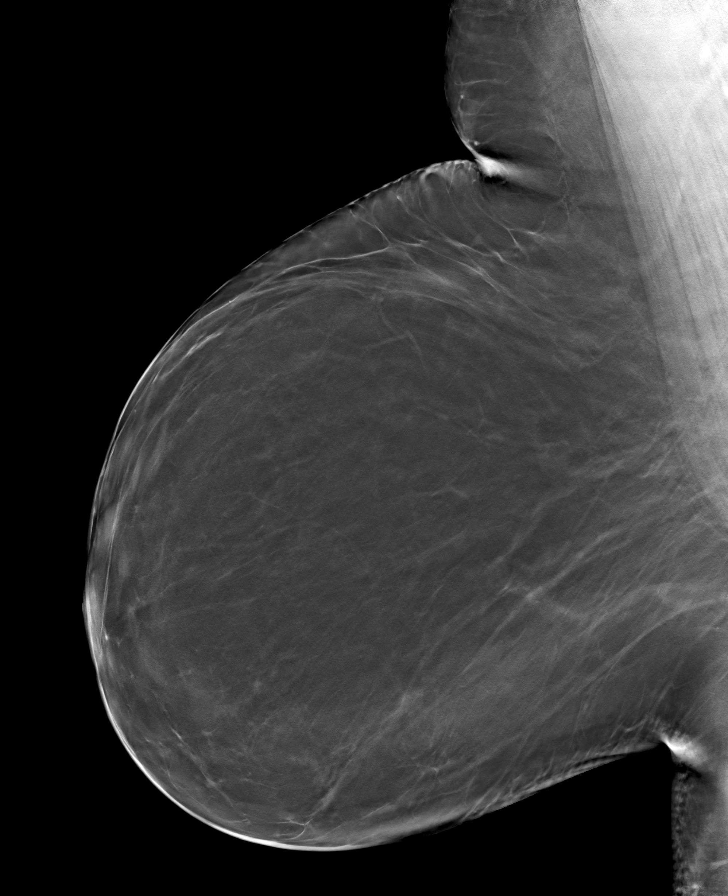

[L MLO tomo · tomo slice 44/87.0]
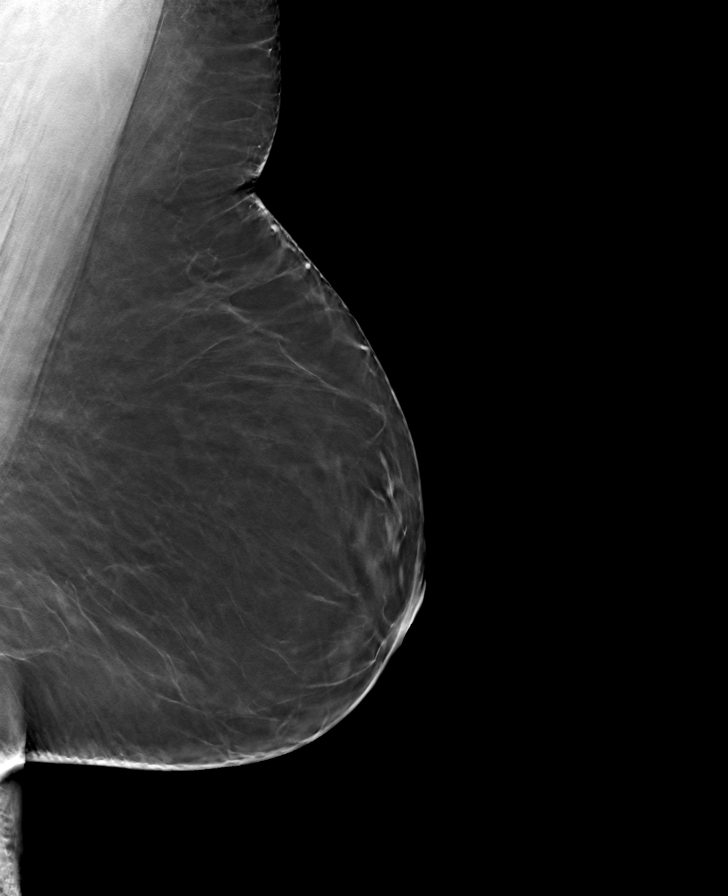

[L CC tomo · tomo slice 37/73.0]
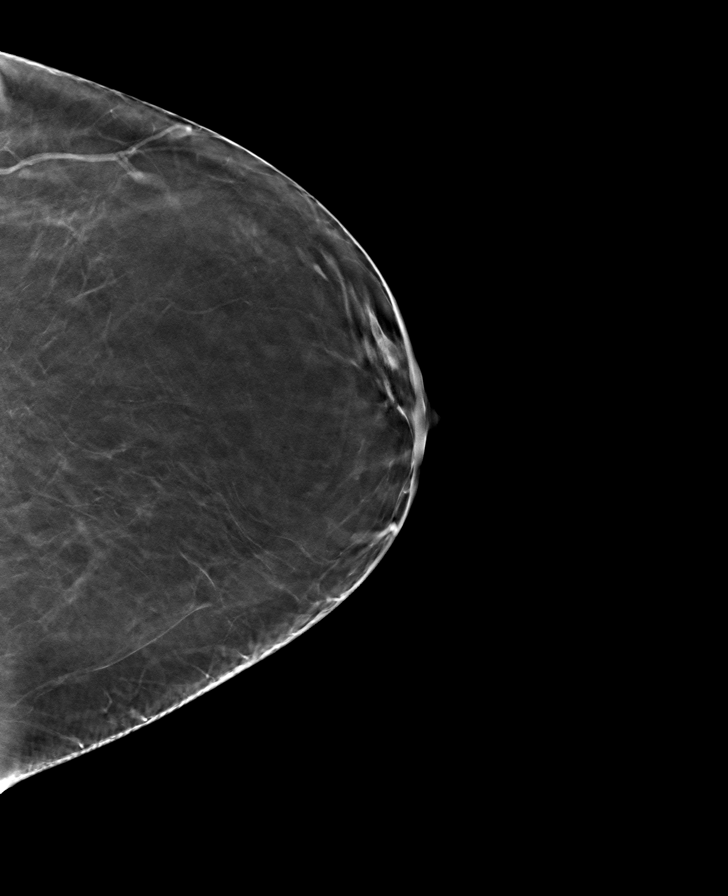

[8 of 24 positions shown; findings below may reference images not displayed]

ACR Breast Density Category b: There are scattered areas of
fibroglandular density.
FINDINGS: There are no findings suspicious for malignancy. Images were
processed with CAD.
IMPRESSION: No mammographic evidence of malignancy. A result letter of this
screening mammogram will be mailed directly to the patient.

RECOMMENDATION:
Screening mammogram in one year. (Code:Y5-G-EJ6)

BI-RADS CATEGORY  1: Negative.

## 2022-11-29 ENCOUNTER — Encounter: Payer: BC Managed Care – PPO | Admitting: Family Medicine

## 2023-01-18 ENCOUNTER — Other Ambulatory Visit: Payer: Self-pay | Admitting: Family Medicine

## 2023-01-18 DIAGNOSIS — I10 Essential (primary) hypertension: Secondary | ICD-10-CM

## 2023-02-09 NOTE — Progress Notes (Deleted)
Name: GERALDINE ALLAIN   MRN: [id number]    DOB: 09-Dec-1975   Date:02/09/2023       Progress Note  Subjective  Chief Complaint  Annual Exam  HPI  Patient presents for annual CPE.  Diet: *** Exercise: ***  Last Eye Exam: *** Last Dental Exam: ***  Flowsheet Row Office Visit from 10/08/2021 in [facility]  AUDIT-C Score 0      Depression: Phq 9 is  {Desc; negative/positive:13464}    11/24/2021    9:21 AM 10/08/2021    1:14 PM 08/17/2021    2:15 PM 03/03/2021    2:44 PM 11/20/2020    7:40 AM  Depression screen PHQ 2/9  Decreased Interest 0 0 0 0 0  Down, Depressed, Hopeless 0 0 0 0 0  PHQ - 2 Score 0 0 0 0 0  Altered sleeping 0      Tired, decreased energy 0      Change in appetite 0      Feeling bad or failure about yourself  0      Trouble concentrating 0      Moving slowly or fidgety/restless 0      Suicidal thoughts 0      PHQ-9 Score 0       Hypertension: BP Readings from Last 3 Encounters:  01/08/22 118/84  11/24/21 132/84  10/08/21 130/82   Obesity: Wt Readings from Last 3 Encounters:  01/08/22 (!) 327 lb 8 oz (148.6 kg)  11/24/21 (!) 326 lb (147.9 kg)  10/08/21 (!) 325 lb (147.4 kg)   BMI Readings from Last 3 Encounters:  01/08/22 46.99 kg/m  11/24/21 46.78 kg/m  10/08/21 46.63 kg/m     Vaccines:   HPV: N/A Tdap: 2014, due Shingrix: N/A Pneumonia: N/A Flu: 2022 COVID-19: up to date   Hep C Screening: 09/12/18 STD testing and prevention (HIV/chl/gon/syphilis): 11/20/20 Intimate partner violence: negative screen  Sexual History : Menstrual History/LMP/Abnormal Bleeding:  Discussed importance of follow up if any post-menopausal bleeding: yes  Incontinence Symptoms: negative for symptoms   Breast cancer:  - Last Mammogram: 07/22/20 - BRCA gene screening: N/A  Osteoporosis Prevention : Discussed high calcium and vitamin D supplementation, weight bearing exercises Bone density: N/A   Cervical cancer screening:  11/24/21  Skin cancer: Discussed monitoring for atypical lesions  Colorectal cancer: N/A   Lung cancer:  Low Dose CT Chest recommended if Age 20-80 years, 20 pack-year currently smoking OR have quit w/in 15years. Patient does not qualify for screen   ECG: 01/08/22  Advanced Care Planning: A voluntary discussion about advance care planning including the explanation and discussion of advance directives.  Discussed health care proxy and Living will, and the patient was able to identify a health care proxy as ***.  Patient does not have a living will and power of attorney of health care   Lipids: Lab Results  Component Value Date   CHOL 153 12/11/2021   CHOL 154 11/20/2020   CHOL 148 10/15/2019   Lab Results  Component Value Date   HDL 53 12/11/2021   HDL 49 (L) 11/20/2020   HDL 48 (L) 10/15/2019   Lab Results  Component Value Date   LDLCALC 82 12/11/2021   LDLCALC 86 11/20/2020   LDLCALC 78 10/15/2019   Lab Results  Component Value Date   TRIG 86 12/11/2021   TRIG 97 11/20/2020   TRIG 119 10/15/2019   Lab Results  Component Value Date   CHOLHDL 2.9 12/11/2021  CHOLHDL 3.1 11/20/2020   CHOLHDL 3.1 10/15/2019   No results found for: "LDLDIRECT"  Glucose: Glucose  Date Value Ref Range Status  02/06/2012 117 (H) 65 - 99 mg/dL Final  16/06/9603 76 65 - 99 mg/dL Final   Glucose, Bld  Date Value Ref Range Status  12/11/2021 118 (H) 65 - 99 mg/dL Final    Comment:    .            Fasting reference interval . For someone without known diabetes, a glucose value between 100 and 125 mg/dL is consistent with prediabetes and should be confirmed with a follow-up test. .   08/17/2021 74 65 - 99 mg/dL Final    Comment:    .            Fasting reference interval .   11/20/2020 111 (H) 65 - 99 mg/dL Final    Comment:    .            Fasting reference interval . For someone without known diabetes, a glucose value between 100 and 125 mg/dL is consistent  with prediabetes and should be confirmed with a follow-up test. .     Patient Active Problem List   Diagnosis Date Noted   Hypertension, benign 08/21/2020   Diabetes mellitus type 2 in obese 07/10/2015   Family history of breast cancer 07/10/2015   Family history of diabetes mellitus 07/10/2015   Morbid obesity ([facility]) 07/10/2015   Vitamin D deficiency 07/10/2015    Past Surgical History:  Procedure Laterality Date   abscess on buttock     had to be drained at the OR   CESAREAN SECTION  06/03/2011   CESAREAN SECTION  2014   CHOLECYSTECTOMY  08/26/2011   PILONIDAL CYST EXCISION      Family History  Problem Relation Age of Onset   Breast cancer Mother        Breast-Diagnosed in 2011 Stage 4   Diabetes Mother    Cancer Maternal Grandmother    Heart failure Paternal Grandmother    Stroke Paternal Grandfather    Diabetes Maternal Aunt    Diabetes Maternal Uncle    Breast cancer Cousin        pat cousin    Social History   Socioeconomic History   Marital status: Married    Spouse name: Antonio    Number of children: 2   Years of education: Not on file   Highest education level: Not on file  Occupational History   Occupation: Fish farm manager: [employer]  Tobacco Use   Smoking status: Never   Smokeless tobacco: Never  Vaping Use   Vaping Use: Never used  Substance and Sexual Activity   Alcohol use: Yes    Alcohol/week: 0.0 standard drinks of alcohol    Comment: occassionally   Drug use: No   Sexual activity: Yes    Partners: Male    Birth control/protection: Surgical    Comment: Tubal Ligation  Other Topics Concern   Not on file  Social History Narrative   Re-married, her husband has two children from a previous relationship   They have two children together   Works at [employer] Strain: Low Risk  (11/24/2021)   Overall Financial Resource Strain (CARDIA)    Difficulty of Paying Living  Expenses: Not hard at all  Food Insecurity: No Food Insecurity (11/24/2021)   Hunger Vital Sign  Worried About Programme researcher, broadcasting/film/video in the Last Year: Never true    Ran Out of Food in the Last Year: Never true  Transportation Needs: No Transportation Needs (11/24/2021)   PRAPARE - Administrator, Civil Service (Medical): No    Lack of Transportation (Non-Medical): No  Physical Activity: Insufficiently Active (11/24/2021)   Exercise Vital Sign    Days of Exercise per Week: 2 days    Minutes of Exercise per Session: 30 min  Stress: No Stress Concern Present (11/24/2021)   [employer] of Occupational Health - Occupational Stress Questionnaire    Feeling of Stress : Not at all  Social Connections: Socially Integrated (11/24/2021)   Social Connection and Isolation Panel [NHANES]    Frequency of Communication with Friends and Family: More than three times a week    Frequency of Social Gatherings with Friends and Family: Once a week    Attends Religious Services: More than 4 times per year    Active Member of [employer] or Organizations: Yes    Attends Banker Meetings: 1 to 4 times per year    Marital Status: Married  Catering manager Violence: Not At Risk (11/24/2021)   Humiliation, Afraid, Rape, and Kick questionnaire    Fear of Current or Ex-Partner: No    Emotionally Abused: No    Physically Abused: No    Sexually Abused: No     Current Outpatient Medications:    Cholecalciferol (VITAMIN D) 50 MCG (2000 UT) CAPS, Take 1 capsule by mouth daily., Disp: , Rfl:    valsartan-hydrochlorothiazide (DIOVAN-HCT) 80-12.5 MG tablet, TAKE 1 TABLET BY MOUTH EVERY DAY, Disp: 90 tablet, Rfl: 0  Allergies  Allergen Reactions   Sulfamethoxazole-Trimethoprim Nausea And Vomiting and Other (See Comments)    Headache, Dizziness, Lightlessness, Sweating, Nausea       ROS  ***  Objective  There were no vitals filed for this visit.  There is no height or weight on file  to calculate BMI.  Physical Exam ***  No results found for this or any previous visit (from the past 2160 hour(s)).   Fall Risk:    11/24/2021    9:20 AM 10/08/2021    1:14 PM 08/17/2021    2:15 PM 03/03/2021    2:44 PM 11/20/2020    7:40 AM  Fall Risk   Falls in the past year? 0 0 0 0 0  Number falls in past yr: 0 0  0 0  Injury with Fall? 0 0  0 0  Risk for fall due to : No Fall Risks      Follow up Falls prevention discussed Falls evaluation completed Falls prevention discussed       Functional Status Survey:     Assessment & Plan  1. Well adult exam ***   -USPSTF grade A and B recommendations reviewed with patient; age-appropriate recommendations, preventive care, screening tests, etc discussed and encouraged; healthy living encouraged; see AVS for patient education given to patient -Discussed importance of 150 minutes of physical activity weekly, eat two servings of fish weekly, eat one serving of tree nuts ( cashews, pistachios, pecans, almonds.Marland Kitchen) every other day, eat 6 servings of fruit/vegetables daily and drink plenty of water and avoid sweet beverages.   -Reviewed Health Maintenance: Yes.

## 2023-02-10 ENCOUNTER — Encounter: Payer: BC Managed Care – PPO | Admitting: Family Medicine

## 2023-02-10 DIAGNOSIS — E1169 Type 2 diabetes mellitus with other specified complication: Secondary | ICD-10-CM

## 2023-02-10 DIAGNOSIS — Z1211 Encounter for screening for malignant neoplasm of colon: Secondary | ICD-10-CM

## 2023-02-10 DIAGNOSIS — Z1231 Encounter for screening mammogram for malignant neoplasm of breast: Secondary | ICD-10-CM

## 2023-02-10 DIAGNOSIS — Z0000 Encounter for general adult medical examination without abnormal findings: Secondary | ICD-10-CM

## 2023-05-10 NOTE — Progress Notes (Deleted)
Name: KEAMBRIA REETER   MRN: [id number]    DOB: 1975-11-21   Date:05/10/2023       Progress Note  Subjective  Chief Complaint  Annual Exam  HPI  Patient presents for annual CPE.  Diet: *** Exercise: ***  Last Eye Exam: *** Last Dental Exam: ***  Flowsheet Row Office Visit from 10/08/2021 in [facility]  AUDIT-C Score 0      Depression: Phq 9 is  {Desc; negative/positive:13464}    11/24/2021    9:21 AM 10/08/2021    1:14 PM 08/17/2021    2:15 PM 03/03/2021    2:44 PM 11/20/2020    7:40 AM  Depression screen PHQ 2/9  Decreased Interest 0 0 0 0 0  Down, Depressed, Hopeless 0 0 0 0 0  PHQ - 2 Score 0 0 0 0 0  Altered sleeping 0      Tired, decreased energy 0      Change in appetite 0      Feeling bad or failure about yourself  0      Trouble concentrating 0      Moving slowly or fidgety/restless 0      Suicidal thoughts 0      PHQ-9 Score 0       Hypertension: BP Readings from Last 3 Encounters:  01/08/22 118/84  11/24/21 132/84  10/08/21 130/82   Obesity: Wt Readings from Last 3 Encounters:  01/08/22 (!) 327 lb 8 oz (148.6 kg)  11/24/21 (!) 326 lb (147.9 kg)  10/08/21 (!) 325 lb (147.4 kg)   BMI Readings from Last 3 Encounters:  01/08/22 46.99 kg/m  11/24/21 46.78 kg/m  10/08/21 46.63 kg/m     Vaccines:   HPV: N/A Tdap: 2014, due Shingrix: N/A Pneumonia: N/A Flu: 2022, due COVID-19: up to date   Hep C Screening: 09/12/18 STD testing and prevention (HIV/chl/gon/syphilis): 11/20/20 Intimate partner violence: negative screen  Sexual History : Menstrual History/LMP/Abnormal Bleeding:  Discussed importance of follow up if any post-menopausal bleeding: yes  Incontinence Symptoms: negative for symptoms   Breast cancer:  - Last Mammogram: 07/22/20, due- ordered today - BRCA gene screening: N/A  Osteoporosis Prevention : Discussed high calcium and vitamin D supplementation, weight bearing exercises Bone density: N/A  Cervical  cancer screening: 11/24/21  Skin cancer: Discussed monitoring for atypical lesions  Colorectal cancer: N/A   Lung cancer:  Low Dose CT Chest recommended if Age 27-80 years, 20 pack-year currently smoking OR have quit w/in 15years. Patient does not qualify for screen   ECG: 01/08/22  Advanced Care Planning: A voluntary discussion about advance care planning including the explanation and discussion of advance directives.  Discussed health care proxy and Living will, and the patient was able to identify a health care proxy as ***.  Patient does not have a living will and power of attorney of health care   Lipids: Lab Results  Component Value Date   CHOL 153 12/11/2021   CHOL 154 11/20/2020   CHOL 148 10/15/2019   Lab Results  Component Value Date   HDL 53 12/11/2021   HDL 49 (L) 11/20/2020   HDL 48 (L) 10/15/2019   Lab Results  Component Value Date   LDLCALC 82 12/11/2021   LDLCALC 86 11/20/2020   LDLCALC 78 10/15/2019   Lab Results  Component Value Date   TRIG 86 12/11/2021   TRIG 97 11/20/2020   TRIG 119 10/15/2019   Lab Results  Component Value Date  CHOLHDL 2.9 12/11/2021   CHOLHDL 3.1 11/20/2020   CHOLHDL 3.1 10/15/2019   No results found for: "LDLDIRECT"  Glucose: Glucose  Date Value Ref Range Status  02/06/2012 117 (H) 65 - 99 mg/dL Final  02/72/5366 76 65 - 99 mg/dL Final   Glucose, Bld  Date Value Ref Range Status  12/11/2021 118 (H) 65 - 99 mg/dL Final    Comment:    .            Fasting reference interval . For someone without known diabetes, a glucose value between 100 and 125 mg/dL is consistent with prediabetes and should be confirmed with a follow-up test. .   08/17/2021 74 65 - 99 mg/dL Final    Comment:    .            Fasting reference interval .   11/20/2020 111 (H) 65 - 99 mg/dL Final    Comment:    .            Fasting reference interval . For someone without known diabetes, a glucose value between 100 and 125 mg/dL is  consistent with prediabetes and should be confirmed with a follow-up test. .     Patient Active Problem List   Diagnosis Date Noted   Hypertension, benign 08/21/2020   Diabetes mellitus type 2 in obese 07/10/2015   Family history of breast cancer 07/10/2015   Family history of diabetes mellitus 07/10/2015   Morbid obesity (HCC) 07/10/2015   Vitamin D deficiency 07/10/2015    Past Surgical History:  Procedure Laterality Date   abscess on buttock     had to be drained at the OR   CESAREAN SECTION  06/03/2011   CESAREAN SECTION  2014   CHOLECYSTECTOMY  08/26/2011   PILONIDAL CYST EXCISION      Family History  Problem Relation Age of Onset   Breast cancer Mother        Breast-Diagnosed in 2011 Stage 4   Diabetes Mother    Cancer Maternal Grandmother    Heart failure Paternal Grandmother    Stroke Paternal Grandfather    Diabetes Maternal Aunt    Diabetes Maternal Uncle    Breast cancer Cousin        pat cousin    Social History   Socioeconomic History   Marital status: Married    Spouse name: Antonio    Number of children: 2   Years of education: Not on file   Highest education level: Not on file  Occupational History   Occupation: Fish farm manager: [employer]  Tobacco Use   Smoking status: Never   Smokeless tobacco: Never  Vaping Use   Vaping status: Never Used  Substance and Sexual Activity   Alcohol use: Yes    Alcohol/week: 0.0 standard drinks of alcohol    Comment: occassionally   Drug use: No   Sexual activity: Yes    Partners: Male    Birth control/protection: Surgical    Comment: Tubal Ligation  Other Topics Concern   Not on file  Social History Narrative   Re-married, her husband has two children from a previous relationship   They have two children together   Works at [employer] Strain: Low Risk  (11/24/2021)   Overall Financial Resource Strain (CARDIA)    Difficulty of Paying  Living Expenses: Not hard at all  Food Insecurity: No Food Insecurity (11/24/2021)   Hunger  Vital Sign    Worried About Programme researcher, broadcasting/film/video in the Last Year: Never true    Ran Out of Food in the Last Year: Never true  Transportation Needs: No Transportation Needs (11/24/2021)   PRAPARE - Administrator, Civil Service (Medical): No    Lack of Transportation (Non-Medical): No  Physical Activity: Insufficiently Active (11/24/2021)   Exercise Vital Sign    Days of Exercise per Week: 2 days    Minutes of Exercise per Session: 30 min  Stress: No Stress Concern Present (11/24/2021)   [employer] of Occupational Health - Occupational Stress Questionnaire    Feeling of Stress : Not at all  Social Connections: Socially Integrated (11/24/2021)   Social Connection and Isolation Panel [NHANES]    Frequency of Communication with Friends and Family: More than three times a week    Frequency of Social Gatherings with Friends and Family: Once a week    Attends Religious Services: More than 4 times per year    Active Member of [employer] or Organizations: Yes    Attends Banker Meetings: 1 to 4 times per year    Marital Status: Married  Catering manager Violence: Not At Risk (11/24/2021)   Humiliation, Afraid, Rape, and Kick questionnaire    Fear of Current or Ex-Partner: No    Emotionally Abused: No    Physically Abused: No    Sexually Abused: No     Current Outpatient Medications:    Cholecalciferol (VITAMIN D) 50 MCG (2000 UT) CAPS, Take 1 capsule by mouth daily., Disp: , Rfl:    valsartan-hydrochlorothiazide (DIOVAN-HCT) 80-12.5 MG tablet, TAKE 1 TABLET BY MOUTH EVERY DAY, Disp: 90 tablet, Rfl: 0  Allergies  Allergen Reactions   Sulfamethoxazole-Trimethoprim Nausea And Vomiting and Other (See Comments)    Headache, Dizziness, Lightlessness, Sweating, Nausea       ROS  ***  Objective  There were no vitals filed for this visit.  There is no height or weight on  file to calculate BMI.  Physical Exam ***  No results found for this or any previous visit (from the past 2160 hour(s)).   Fall Risk:    11/24/2021    9:20 AM 10/08/2021    1:14 PM 08/17/2021    2:15 PM 03/03/2021    2:44 PM 11/20/2020    7:40 AM  Fall Risk   Falls in the past year? 0 0 0 0 0  Number falls in past yr: 0 0  0 0  Injury with Fall? 0 0  0 0  Risk for fall due to : No Fall Risks      Follow up Falls prevention discussed Falls evaluation completed Falls prevention discussed       Functional Status Survey:     Assessment & Plan  1. Well adult exam ***   -USPSTF grade A and B recommendations reviewed with patient; age-appropriate recommendations, preventive care, screening tests, etc discussed and encouraged; healthy living encouraged; see AVS for patient education given to patient -Discussed importance of 150 minutes of physical activity weekly, eat two servings of fish weekly, eat one serving of tree nuts ( cashews, pistachios, pecans, almonds.Marland Kitchen) every other day, eat 6 servings of fruit/vegetables daily and drink plenty of water and avoid sweet beverages.   -Reviewed Health Maintenance: Yes.

## 2023-05-11 ENCOUNTER — Encounter: Payer: BC Managed Care – PPO | Admitting: Family Medicine

## 2023-06-14 ENCOUNTER — Other Ambulatory Visit: Payer: Self-pay | Admitting: Family Medicine

## 2023-06-14 DIAGNOSIS — I10 Essential (primary) hypertension: Secondary | ICD-10-CM

## 2023-08-30 ENCOUNTER — Encounter: Payer: BC Managed Care – PPO | Admitting: Family Medicine

## 2023-09-08 ENCOUNTER — Encounter: Payer: BC Managed Care – PPO | Admitting: Family Medicine

## 2023-10-19 ENCOUNTER — Encounter: Payer: Self-pay | Admitting: Family Medicine

## 2023-11-03 ENCOUNTER — Ambulatory Visit (INDEPENDENT_AMBULATORY_CARE_PROVIDER_SITE_OTHER): Payer: 59 | Admitting: Family Medicine

## 2023-11-03 ENCOUNTER — Encounter: Payer: Self-pay | Admitting: Family Medicine

## 2023-11-03 VITALS — BP 152/92 | HR 85 | Temp 97.9°F | Resp 16 | Ht 70.0 in | Wt 328.9 lb

## 2023-11-03 DIAGNOSIS — E669 Obesity, unspecified: Secondary | ICD-10-CM

## 2023-11-03 DIAGNOSIS — E785 Hyperlipidemia, unspecified: Secondary | ICD-10-CM

## 2023-11-03 DIAGNOSIS — Z1211 Encounter for screening for malignant neoplasm of colon: Secondary | ICD-10-CM

## 2023-11-03 DIAGNOSIS — I152 Hypertension secondary to endocrine disorders: Secondary | ICD-10-CM

## 2023-11-03 DIAGNOSIS — Z0000 Encounter for general adult medical examination without abnormal findings: Secondary | ICD-10-CM

## 2023-11-03 DIAGNOSIS — Z0001 Encounter for general adult medical examination with abnormal findings: Secondary | ICD-10-CM | POA: Diagnosis not present

## 2023-11-03 DIAGNOSIS — E1169 Type 2 diabetes mellitus with other specified complication: Secondary | ICD-10-CM

## 2023-11-03 DIAGNOSIS — E1159 Type 2 diabetes mellitus with other circulatory complications: Secondary | ICD-10-CM

## 2023-11-03 DIAGNOSIS — I10 Essential (primary) hypertension: Secondary | ICD-10-CM | POA: Diagnosis not present

## 2023-11-03 DIAGNOSIS — Z23 Encounter for immunization: Secondary | ICD-10-CM

## 2023-11-03 DIAGNOSIS — Z1231 Encounter for screening mammogram for malignant neoplasm of breast: Secondary | ICD-10-CM

## 2023-11-03 LAB — POCT GLYCOSYLATED HEMOGLOBIN (HGB A1C): Hemoglobin A1C: 9 % — AB (ref 4.0–5.6)

## 2023-11-03 MED ORDER — VALSARTAN-HYDROCHLOROTHIAZIDE 80-12.5 MG PO TABS: 1.0000 | ORAL_TABLET | Freq: Every day | ORAL | 0 refills | Status: DC

## 2023-11-03 MED ORDER — TIRZEPATIDE 2.5 MG/0.5ML ~~LOC~~ SOAJ: 2.5000 mg | SUBCUTANEOUS | 0 refills | Status: DC

## 2023-11-03 MED ORDER — TIRZEPATIDE 5 MG/0.5ML ~~LOC~~ SOAJ: 5.0000 mg | SUBCUTANEOUS | 0 refills | Status: DC

## 2023-11-03 NOTE — Progress Notes (Signed)
 Name: Bridget Weiss   MRN: [id number]    DOB: 18-Nov-1975   Date:11/03/2023       Progress Note  Subjective  Chief Complaint  Chief Complaint  Patient presents with   Annual Exam    HPI  Patient presents for annual CPE and follow up  HTN: she has a history of palpitation, no chest pain or SOB. She is willing to take her medication now, she has not been seen since 2023. Willing to be compliant from now on.   Type 2 DM associated with HTN, obesity, dyslipidemia. A1C today is 9 %, she is not compliant with visits or diet. She is wiling to try GLP-1 agonist. Denies family history of thyroid cancer or personal history of pancreatitis. Denies polyphagia, but has polydipsia and intermittent polyuria. We will recheck lipid panel   Morbid obesity: BMI of 47, explained importance of weight loss, she is willing to try GLP-1 agonist   Diet: cooking at home, still eats fried food, but tries to grill and bake also , she thinks her diet could improve by eating smaller portions and eating earlier in the evening  Exercise: she needs to increase regular physical activity   Last Eye Exam:  due for a visit Last Dental Exam:  due for a visit   Flowsheet Row Office Visit from 11/03/2023 in [facility]  AUDIT-C Score 0      Depression: Phq 9 is  negative    11/03/2023    9:00 AM 11/24/2021    9:21 AM 10/08/2021    1:14 PM 08/17/2021    2:15 PM 03/03/2021    2:44 PM  Depression screen PHQ 2/9  Decreased Interest 0 0 0 0 0  Down, Depressed, Hopeless 0 0 0 0 0  PHQ - 2 Score 0 0 0 0 0  Altered sleeping  0     Tired, decreased energy  0     Change in appetite  0     Feeling bad or failure about yourself   0     Trouble concentrating  0     Moving slowly or fidgety/restless  0     Suicidal thoughts  0     PHQ-9 Score  0      Hypertension: BP Readings from Last 3 Encounters:  11/03/23 (!) 154/92  01/08/22 118/84  11/24/21 132/84   Obesity: Wt Readings from Last 3  Encounters:  11/03/23 (!) 328 lb 14.4 oz (149.2 kg)  01/08/22 (!) 327 lb 8 oz (148.6 kg)  11/24/21 (!) 326 lb (147.9 kg)   BMI Readings from Last 3 Encounters:  11/03/23 47.19 kg/m  01/08/22 46.99 kg/m  11/24/21 46.78 kg/m     Vaccines: reviewed with the patient.   Hep C Screening: completed STD testing and prevention (HIV/chl/gon/syphilis): N/A Intimate partner violence: negative screen  Sexual History : no problems  Menstrual History/LMP/Abnormal Bleeding: regular cycles , not very heavy  Discussed importance of follow up if any post-menopausal bleeding: not applicable  Incontinence Symptoms: negative for symptoms   Breast cancer:  - Last Mammogram: due for mammogram  - BRCA gene screening:   Osteoporosis Prevention : Discussed high calcium and vitamin D supplementation, weight bearing exercises Bone density :not applicable   Cervical cancer screening: up-to-date  Skin cancer: Discussed monitoring for atypical lesions recurrent cyst that drains from right posterior axillary line- discussed referral to surgeon but wants to hold off for now Colorectal cancer: she wants to have colonoscopy  Lung cancer:  Low Dose CT Chest recommended if Age 78-80 years, 20 pack-year currently smoking OR have quit w/in 15years. Patient does not qualify for screen   ECG: 2023  Advanced Care Planning: A voluntary discussion about advance care planning including the explanation and discussion of advance directives.  Discussed health care proxy and Living will, and the patient was able to identify a health care proxy as husband.  Patient does not have a living will and power of attorney of health care   Patient Active Problem List   Diagnosis Date Noted   Hypertension, benign 08/21/2020   Type 2 diabetes mellitus with obesity (HCC) 07/10/2015   Family history of breast cancer 07/10/2015   Family history of diabetes mellitus 07/10/2015   Morbid obesity (HCC) 07/10/2015   Vitamin D deficiency  07/10/2015    Past Surgical History:  Procedure Laterality Date   abscess on buttock     had to be drained at the OR   CESAREAN SECTION  06/03/2011   CESAREAN SECTION  2014   CHOLECYSTECTOMY  08/26/2011   PILONIDAL CYST EXCISION      Family History  Problem Relation Age of Onset   Breast cancer Mother        Breast-Diagnosed in 2011 Stage 4   Diabetes Mother    Cancer Maternal Grandmother    Heart failure Paternal Grandmother    Stroke Paternal Grandfather    Diabetes Maternal Aunt    Diabetes Maternal Uncle    Breast cancer Cousin        pat cousin    Social History   Socioeconomic History   Marital status: Married    Spouse name: Antonio    Number of children: 2   Years of education: Not on file   Highest education level: Not on file  Occupational History   Occupation: Fish farm manager: [employer]  Tobacco Use   Smoking status: Never   Smokeless tobacco: Never  Vaping Use   Vaping status: Never Used  Substance and Sexual Activity   Alcohol use: Not Currently    Comment: occassionally   Drug use: No   Sexual activity: Yes    Partners: Male    Birth control/protection: Surgical    Comment: Tubal Ligation  Other Topics Concern   Not on file  Social History Narrative   Re-married, her husband has two children from a previous relationship   They have two children together   Works at [employer]: Low Risk  (11/03/2023)   Overall Financial Resource Strain (CARDIA)    Difficulty of Paying Living Expenses: Not hard at all  Food Insecurity: No Food Insecurity (11/03/2023)   Hunger Vital Sign    Worried About Running Out of Food in the Last Year: Never true    Ran Out of Food in the Last Year: Never true  Transportation Needs: No Transportation Needs (11/03/2023)   PRAPARE - Administrator, Civil Service (Medical): No    Lack of Transportation (Non-Medical): No  Physical Activity:  Insufficiently Active (11/03/2023)   Exercise Vital Sign    Days of Exercise per Week: 3 days    Minutes of Exercise per Session: 30 min  Stress: Stress Concern Present (11/03/2023)   [employer] of Occupational Health - Occupational Stress Questionnaire    Feeling of Stress : To some extent  Social Connections: Moderately Integrated (11/03/2023)   Social Connection and Isolation  Panel [NHANES]    Frequency of Communication with Friends and Family: More than three times a week    Frequency of Social Gatherings with Friends and Family: More than three times a week    Attends Religious Services: Never    Database administrator or Organizations: Yes    Attends Banker Meetings: 1 to 4 times per year    Marital Status: Married  Catering manager Violence: Not At Risk (11/03/2023)   Humiliation, Afraid, Rape, and Kick questionnaire    Fear of Current or Ex-Partner: No    Emotionally Abused: No    Physically Abused: No    Sexually Abused: No     Current Outpatient Medications:    Cholecalciferol (VITAMIN D) 50 MCG (2000 UT) CAPS, Take 1 capsule by mouth daily., Disp: , Rfl:    valsartan-hydrochlorothiazide (DIOVAN-HCT) 80-12.5 MG tablet, TAKE 1 TABLET BY MOUTH EVERY DAY, Disp: 90 tablet, Rfl: 0  Allergies  Allergen Reactions   Sulfamethoxazole-Trimethoprim Nausea And Vomiting and Other (See Comments)    Headache, Dizziness, Lightlessness, Sweating, Nausea       ROS  Constitutional: Negative for fever , no weight change.  Respiratory: Negative for cough and shortness of breath.   Cardiovascular: Negative for chest pain or palpitations.  Gastrointestinal: Negative for abdominal pain, no bowel changes.  Musculoskeletal: Negative for gait problem or joint swelling.  Skin: Negative for rash.  Neurological: Negative for dizziness or headache.  No other specific complaints in a complete review of systems (except as listed in HPI above).   Objective  Vitals:    11/03/23 0907  BP: (!) 154/92  Pulse: 85  Resp: 16  Temp: 97.9 F (36.6 C)  TempSrc: Oral  SpO2: 95%  Weight: (!) 328 lb 14.4 oz (149.2 kg)  Height: 5\' 10"  (1.778 m)    Body mass index is 47.19 kg/m.  Physical Exam  Constitutional: Patient appears well-developed and well-nourished. No distress.  HENT: Head: Normocephalic and atraumatic. Ears: B TMs ok, no erythema or effusion; Nose: Nose normal. Mouth/Throat: Oropharynx is clear and moist. No oropharyngeal exudate.  Eyes: Conjunctivae and EOM are normal. Pupils are equal, round, and reactive to light. No scleral icterus.  Neck: Normal range of motion. Neck supple. No JVD present. No thyromegaly present.  Cardiovascular: Normal rate, regular rhythm and normal heart sounds.  No murmur heard. No BLE edema. Pulmonary/Chest: Effort normal and breath sounds normal. No respiratory distress. Abdominal: Soft. Bowel sounds are normal, no distension. There is no tenderness. no masses Breast: no lumps or masses, no nipple discharge or rashes FEMALE GENITALIA:  Not done  RECTAL: not done  Musculoskeletal: Normal range of motion, no joint effusions. No gross deformities Neurological: he is alert and oriented to person, place, and time. No cranial nerve deficit. Coordination, balance, strength, speech and gait are normal.  Skin: Skin is warm and dry. No rash noted. No erythema.  Psychiatric: Patient has a normal mood and affect. behavior is normal. Judgment and thought content normal.     Assessment & Plan   1. Well adult exam (Primary)  - MM 3D SCREENING MAMMOGRAM BILATERAL BREAST; Future - Ambulatory referral to Gastroenterology - Lipid panel - Microalbumin / creatinine urine ratio - CBC with Differential/Platelet - COMPLETE METABOLIC PANEL WITH GFR - POCT glycosylated hemoglobin (Hb A1C)  2. Obesity, diabetes, and hypertension syndrome (HCC)  Start medication, weight loss, resume bp medications  3. Dyslipidemia associated with  type 2 diabetes mellitus (HCC)  - Lipid panel -  Microalbumin / creatinine urine ratio - COMPLETE METABOLIC PANEL WITH GFR - POCT glycosylated hemoglobin (Hb A1C)  4. Encounter for screening mammogram for malignant neoplasm of breast  - MM 3D SCREENING MAMMOGRAM BILATERAL BREAST; Future  5. Screening for colon cancer  - Ambulatory referral to Gastroenterology  6. Need for Tdap vaccination  - Tdap vaccine greater than or equal to 7yo IM  7. Hypertension, benign  - CBC with Differential/Platelet - COMPLETE METABOLIC PANEL WITH GFR  8. Essential hypertension  - valsartan-hydrochlorothiazide (DIOVAN-HCT) 80-12.5 MG tablet; Take 1 tablet by mouth daily.  Dispense: 90 tablet; Refill: 0   -USPSTF grade A and B recommendations reviewed with patient; age-appropriate recommendations, preventive care, screening tests, etc discussed and encouraged; healthy living encouraged; see AVS for patient education given to patient -Discussed importance of 150 minutes of physical activity weekly, eat two servings of fish weekly, eat one serving of tree nuts ( cashews, pistachios, pecans, almonds.Marland Kitchen) every other day, eat 6 servings of fruit/vegetables daily and drink plenty of water and avoid sweet beverages.   -Reviewed Health Maintenance: Yes.

## 2023-11-04 ENCOUNTER — Encounter: Payer: Self-pay | Admitting: Family Medicine

## 2023-11-04 LAB — COMPLETE METABOLIC PANEL WITH GFR
AG Ratio: 1.3 (calc) (ref 1.0–2.5)
ALT: 14 U/L (ref 6–29)
AST: 12 U/L (ref 10–35)
Albumin: 3.9 g/dL (ref 3.6–5.1)
Alkaline phosphatase (APISO): 93 U/L (ref 31–125)
BUN: 7 mg/dL (ref 7–25)
CO2: 27 mmol/L (ref 20–32)
Calcium: 9.2 mg/dL (ref 8.6–10.2)
Chloride: 102 mmol/L (ref 98–110)
Creat: 0.66 mg/dL (ref 0.50–0.99)
Globulin: 3 g/dL (ref 1.9–3.7)
Glucose, Bld: 145 mg/dL — ABNORMAL HIGH (ref 65–99)
Potassium: 4.3 mmol/L (ref 3.5–5.3)
Sodium: 137 mmol/L (ref 135–146)
Total Bilirubin: 1 mg/dL (ref 0.2–1.2)
Total Protein: 6.9 g/dL (ref 6.1–8.1)
eGFR: 109 mL/min/{1.73_m2} (ref >=60)

## 2023-11-04 LAB — CBC WITH DIFFERENTIAL/PLATELET
Absolute Lymphocytes: 1764 {cells}/uL (ref 850–3900)
Absolute Monocytes: 402 {cells}/uL (ref 200–950)
Basophils Absolute: 39 {cells}/uL (ref 0–200)
Basophils Relative: 0.8 %
Eosinophils Absolute: 59 {cells}/uL (ref 15–500)
Eosinophils Relative: 1.2 %
HCT: 42.9 % (ref 35.0–45.0)
Hemoglobin: 13.6 g/dL (ref 11.7–15.5)
MCH: 26.8 pg — ABNORMAL LOW (ref 27.0–33.0)
MCHC: 31.7 g/dL — ABNORMAL LOW (ref 32.0–36.0)
MCV: 84.4 fL (ref 80.0–100.0)
MPV: 10.4 fL (ref 7.5–12.5)
Monocytes Relative: 8.2 %
Neutro Abs: 2636 {cells}/uL (ref 1500–7800)
Neutrophils Relative %: 53.8 %
Platelets: 345 10*3/uL (ref 140–400)
RBC: 5.08 10*6/uL (ref 3.80–5.10)
RDW: 14.8 % (ref 11.0–15.0)
Total Lymphocyte: 36 %
WBC: 4.9 10*3/uL (ref 3.8–10.8)

## 2023-11-04 LAB — MICROALBUMIN / CREATININE URINE RATIO
Creatinine, Urine: 184 mg/dL (ref 20–275)
Microalb Creat Ratio: 3 mg/g{creat} (ref <30)
Microalb, Ur: 0.6 mg/dL

## 2023-11-04 LAB — LIPID PANEL
Cholesterol: 155 mg/dL (ref <200)
HDL: 49 mg/dL — ABNORMAL LOW (ref >=50)
LDL Cholesterol (Calc): 89 mg/dL
Non-HDL Cholesterol (Calc): 106 mg/dL (ref <130)
Total CHOL/HDL Ratio: 3.2 (calc) (ref <5.0)
Triglycerides: 81 mg/dL (ref <150)

## 2023-11-10 ENCOUNTER — Encounter: Payer: Self-pay | Admitting: Family Medicine

## 2023-11-10 ENCOUNTER — Encounter: Payer: Self-pay | Admitting: *Deleted

## 2024-02-02 ENCOUNTER — Ambulatory Visit: Payer: 59 | Admitting: Family Medicine

## 2024-02-13 ENCOUNTER — Ambulatory Visit: Admitting: Family Medicine

## 2024-02-23 ENCOUNTER — Other Ambulatory Visit: Payer: Self-pay | Admitting: Family Medicine

## 2024-02-23 DIAGNOSIS — I10 Essential (primary) hypertension: Secondary | ICD-10-CM

## 2024-04-03 ENCOUNTER — Encounter: Payer: Self-pay | Admitting: Family Medicine

## 2024-04-03 ENCOUNTER — Ambulatory Visit: Admitting: Family Medicine

## 2024-04-03 VITALS — BP 126/70 | HR 73 | Resp 16 | Ht 70.0 in | Wt 313.0 lb

## 2024-04-03 DIAGNOSIS — E785 Hyperlipidemia, unspecified: Secondary | ICD-10-CM

## 2024-04-03 DIAGNOSIS — E669 Obesity, unspecified: Secondary | ICD-10-CM | POA: Diagnosis not present

## 2024-04-03 DIAGNOSIS — I152 Hypertension secondary to endocrine disorders: Secondary | ICD-10-CM

## 2024-04-03 DIAGNOSIS — E1169 Type 2 diabetes mellitus with other specified complication: Secondary | ICD-10-CM | POA: Diagnosis not present

## 2024-04-03 DIAGNOSIS — Z7985 Long-term (current) use of injectable non-insulin antidiabetic drugs: Secondary | ICD-10-CM

## 2024-04-03 DIAGNOSIS — E1159 Type 2 diabetes mellitus with other circulatory complications: Secondary | ICD-10-CM

## 2024-04-03 DIAGNOSIS — I10 Essential (primary) hypertension: Secondary | ICD-10-CM | POA: Diagnosis not present

## 2024-04-03 LAB — POCT GLYCOSYLATED HEMOGLOBIN (HGB A1C): Hemoglobin A1C: 5.6 % (ref 4.0–5.6)

## 2024-04-03 MED ORDER — TIRZEPATIDE 7.5 MG/0.5ML ~~LOC~~ SOAJ: 7.5000 mg | SUBCUTANEOUS | 1 refills | Status: DC

## 2024-04-03 MED ORDER — VALSARTAN-HYDROCHLOROTHIAZIDE 80-12.5 MG PO TABS: 1.0000 | ORAL_TABLET | Freq: Every day | ORAL | 1 refills | Status: DC

## 2024-04-03 NOTE — Progress Notes (Signed)
 Name: Bridget Weiss   MRN: [id number]    DOB: [birth year]   Date:04/03/2024       Progress Note  Subjective  Chief Complaint  Chief Complaint  Patient presents with   Medical Management of Chronic Issues   Discussed the use of AI scribe software for clinical note transcription with the patient, who gave verbal consent to proceed.  History of Present Illness Bridget Weiss is a 48 year old female with diabetes and obesity who presents for a follow-up visit.  She has been taking Mounjaro  5 mg for diabetes management, resulting in significant improvement. Her A1c has decreased from 9.3% in February to 5.6% currently. She has lost 15 pounds since her last visit, with her weight decreasing from 328 pounds to 313 pounds. Mounjaro  has helped curb her appetite, leading to smaller portion sizes and intermittent fasting. She typically eats at 7 PM and may not eat again until 1 PM the next day. She drinks black coffee with cream in the morning and has significantly cut down on calories.  Her physical activity has increased; she has been walking three miles three times a week since July, taking about 50 minutes each time. No symptoms of diabetes such as excessive hunger, thirst, or frequent urination, although urination frequency is unchanged due to her blood pressure medication.  She is currently taking Valsartan  HCTZ 80/12.5 mg for hypertension. Her blood pressure today was 126/70 mmHg. No chest pain or palpitations.  She works from home and has been able to incorporate her exercise routine around her daughter's basketball practice. She does not report any joint pain and does not stand for long periods at work.    Patient Active Problem List   Diagnosis Date Noted   Hypertension, benign 08/21/2020   Obesity, diabetes, and hypertension syndrome (HCC) 07/10/2015   Family history of breast cancer 07/10/2015   Family history of diabetes mellitus 07/10/2015   Morbid obesity (HCC) 07/10/2015   Vitamin D   deficiency 07/10/2015    Past Surgical History:  Procedure Laterality Date   abscess on buttock     had to be drained at the OR   CESAREAN SECTION  06/03/2011   CESAREAN SECTION  2014   CHOLECYSTECTOMY  08/26/2011   PILONIDAL CYST EXCISION      Family History  Problem Relation Age of Onset   Breast cancer Mother        Breast-Diagnosed in 2011 Stage 4   Diabetes Mother    Cancer Maternal Grandmother    Heart failure Paternal Grandmother    Stroke Paternal Grandfather    Diabetes Maternal Aunt    Diabetes Maternal Uncle    Breast cancer Cousin        pat cousin    Social History   Tobacco Use   Smoking status: Never   Smokeless tobacco: Never  Substance Use Topics   Alcohol use: Not Currently    Comment: occassionally     Current Outpatient Medications:    Cholecalciferol  (VITAMIN D ) 50 MCG (2000 UT) CAPS, Take 1 capsule by mouth daily., Disp: , Rfl:    valsartan -hydrochlorothiazide  (DIOVAN -HCT) 80-12.5 MG tablet, TAKE 1 TABLET BY MOUTH EVERY DAY, Disp: 30 tablet, Rfl: 1  Allergies  Allergen Reactions   Sulfamethoxazole-Trimethoprim Nausea And Vomiting and Other (See Comments)    Headache, Dizziness, Lightlessness, Sweating, Nausea      I personally reviewed active problem list, medication list, allergies, family history with the patient/caregiver today.   ROS  Ten systems reviewed  and is negative except as mentioned in HPI    Objective Physical Exam  CONSTITUTIONAL: Patient appears well-developed and well-nourished. No distress. HEENT: Head atraumatic, normocephalic, neck supple. CARDIOVASCULAR: Normal rate, regular rhythm and normal heart sounds. No murmur heard. No BLE edema. Extremities normal. PULMONARY: Effort normal and breath sounds normal. No respiratory distress. ABDOMINAL: There is no tenderness or distention. MUSCULOSKELETAL: Normal gait. Without gross motor or sensory deficit. PSYCHIATRIC: Patient has a normal mood and affect. Behavior is  normal. Judgment and thought content normal.  Vitals:   04/03/24 0848  BP: 126/70  Pulse: 73  Resp: 16  SpO2: 95%  Weight: (!) 313 lb (142 kg)  Height: 5' 10 (1.778 m)    Body mass index is 44.91 kg/m.  Recent Results (from the past 2160 hours)  POCT HgB A1C     Status: None   Collection Time: 04/03/24  8:59 AM  Result Value Ref Range   Hemoglobin A1C 5.6 4.0 - 5.6 %   HbA1c POC (<> result, manual entry)     HbA1c, POC (prediabetic range)     HbA1c, POC (controlled diabetic range)      Diabetic Foot Exam:  Diabetic foot exam was performed with the following findings:   No deformities, ulcerations, or other skin breakdown Normal sensation of 10g monofilament Intact posterior tibialis and dorsalis pedis pulses      PHQ2/9:    11/03/2023    9:00 AM 11/24/2021    9:21 AM 10/08/2021    1:14 PM 08/17/2021    2:15 PM 03/03/2021    2:44 PM  Depression screen PHQ 2/9  Decreased Interest 0 0 0 0 0  Down, Depressed, Hopeless 0 0 0 0 0  PHQ - 2 Score 0 0 0 0 0  Altered sleeping  0     Tired, decreased energy  0     Change in appetite  0     Feeling bad or failure about yourself   0     Trouble concentrating  0     Moving slowly or fidgety/restless  0     Suicidal thoughts  0     PHQ-9 Score  0       phq 9 is negative  Fall Risk:    11/24/2021    9:20 AM 10/08/2021    1:14 PM 08/17/2021    2:15 PM 03/03/2021    2:44 PM 11/20/2020    7:40 AM  Fall Risk   Falls in the past year? 0 0 0 0 0  Number falls in past yr: 0 0  0 0  Injury with Fall? 0 0  0 0  Risk for fall due to : No Fall Risks      Follow up Falls prevention discussed  Falls evaluation completed  Falls prevention discussed        Data saved with a previous flowsheet row definition      Assessment & Plan Type 2 diabetes mellitus with HTN, Morbid obesity Diabetes well-controlled with Mounjaro , A1c reduced from 9.3% to 5.6%. 15-pound weight loss noted. No hyperglycemia symptoms, medication  well-tolerated. - Increase Mounjaro  to 7.5 mg for enhanced weight loss and diabetes control. - Discuss side effects of increased dose, option to revert to 5 mg if intolerable. - Continue lifestyle modifications: intermittent fasting, increased physical activity. - Ensure up-to-date eye exam.  Morbid Obesity - BMI over 40 Obesity managed with Mounjaro  and lifestyle changes. 15-pound weight loss noted, beneficial for overall health. - Continue  Mounjaro  at 7.5 mg for weight loss. - Encourage physical activity and dietary modifications.  Essential hypertension Hypertension well-controlled with valsartan  HCTZ 80/12.5 mg. BP 126/70 mmHg, within target. Weight loss and lifestyle changes expected to aid further. - Continue valsartan  HCTZ 80/12.5 mg.  General Health Maintenance Due for health screenings and vaccinations. Eye exam, mammogram, colonoscopy pending. Discussed pneumococcal vaccine benefits, patient hesitant. - Order colonoscopy screening. - Encourage scheduling of mammogram and eye exam. - Discuss pneumococcal vaccine benefits, address concerns.  Follow-up Follow-up planned for continued management of diabetes, obesity, and hypertension. - Schedule follow-up in five months. - Provide Mounjaro  prescription with one refill for 90 days.

## 2024-04-03 NOTE — Patient Instructions (Signed)
[facility]: [phone] 

## 2024-09-11 ENCOUNTER — Encounter: Payer: Self-pay | Admitting: Family Medicine

## 2024-09-11 ENCOUNTER — Ambulatory Visit: Admitting: Family Medicine

## 2024-09-11 VITALS — BP 128/84 | HR 74 | Resp 16 | Ht 70.0 in | Wt 286.9 lb

## 2024-09-11 DIAGNOSIS — Z1211 Encounter for screening for malignant neoplasm of colon: Secondary | ICD-10-CM

## 2024-09-11 DIAGNOSIS — Z7985 Long-term (current) use of injectable non-insulin antidiabetic drugs: Secondary | ICD-10-CM

## 2024-09-11 DIAGNOSIS — I10 Essential (primary) hypertension: Secondary | ICD-10-CM

## 2024-09-11 DIAGNOSIS — Z1231 Encounter for screening mammogram for malignant neoplasm of breast: Secondary | ICD-10-CM

## 2024-09-11 DIAGNOSIS — Z6841 Body Mass Index (BMI) 40.0 and over, adult: Secondary | ICD-10-CM

## 2024-09-11 DIAGNOSIS — E785 Hyperlipidemia, unspecified: Secondary | ICD-10-CM

## 2024-09-11 DIAGNOSIS — E1169 Type 2 diabetes mellitus with other specified complication: Secondary | ICD-10-CM

## 2024-09-11 DIAGNOSIS — E66813 Obesity, class 3: Secondary | ICD-10-CM

## 2024-09-11 DIAGNOSIS — E119 Type 2 diabetes mellitus without complications: Secondary | ICD-10-CM

## 2024-09-11 LAB — POCT GLYCOSYLATED HEMOGLOBIN (HGB A1C): Hemoglobin A1C: 5.4 % (ref 4.0–5.6)

## 2024-09-11 MED ORDER — TIRZEPATIDE 7.5 MG/0.5ML ~~LOC~~ SOAJ: 7.5000 mg | SUBCUTANEOUS | 1 refills | Status: AC

## 2024-09-11 MED ORDER — VALSARTAN-HYDROCHLOROTHIAZIDE 80-12.5 MG PO TABS: 1.0000 | ORAL_TABLET | Freq: Every day | ORAL | 1 refills | Status: AC

## 2024-09-11 NOTE — Progress Notes (Signed)
 Name: Bridget Weiss   MRN: [id number]    DOB: [birth year]   Date:09/11/2024       Progress Note  Subjective  Chief Complaint  Chief Complaint  Patient presents with   Medical Management of Chronic Issues   Discussed the use of AI scribe software for clinical note transcription with the patient, who gave verbal consent to proceed.  History of Present Illness Bridget Weiss is a 49 year old female with obesity, type 2 diabetes, and hypertension who presents for a follow-up visit.  She has experienced significant weight loss, going from 313 pounds to 286.9 pounds, despite the holiday season. She attributes this to medication and changes in her eating habits, such as reducing the frequency of consuming high-calorie foods like burgers and fries to once a month and eating more meals at home due to working from home. She is aiming to lose a total of 50 pounds by her birthday on [birth year], having already lost 42 pounds since starting at 328 pounds. She plans to continue her weight loss efforts while on an upcoming cruise by engaging in scheduled physical activities and making healthy food choices.  Her exercise frequency has decreased since Thanksgiving, from three to four times a week to twice a week, due to illness lasting a week and a half. Her exercise routine includes walking three to four miles and doing step aerobics and weight lifting at home. She plans to increase her activity level again.  She has type 2 diabetes, which is currently managed with Mounjaro  7.5 mg. Her A1c has improved significantly from 9% in February 2025 to 5.4% currently. She is mindful of her carbohydrate intake and focuses on consuming less carbs.  She has hypertension, which is managed with Valsartan  HCTZ 80/12.5 mg. Her blood pressure is currently 128/84 mmHg.  No significant hair loss reported despite weight loss.    Patient Active Problem List   Diagnosis Date Noted   Hypertension, benign 08/21/2020   Obesity,  diabetes, and hypertension syndrome (HCC) 07/10/2015   Family history of breast cancer 07/10/2015   Family history of diabetes mellitus 07/10/2015   Morbid obesity (HCC) 07/10/2015   Vitamin D  deficiency 07/10/2015    Past Surgical History:  Procedure Laterality Date   abscess on buttock     had to be drained at the OR   CESAREAN SECTION  06/03/2011   CESAREAN SECTION  2014   CHOLECYSTECTOMY  08/26/2011   PILONIDAL CYST EXCISION      Family History  Problem Relation Age of Onset   Breast cancer Mother        Breast-Diagnosed in 2011 Stage 4   Diabetes Mother    Cancer Maternal Grandmother    Heart failure Paternal Grandmother    Stroke Paternal Grandfather    Diabetes Maternal Aunt    Diabetes Maternal Uncle    Breast cancer Cousin        pat cousin    Social History   Tobacco Use   Smoking status: Never   Smokeless tobacco: Never  Substance Use Topics   Alcohol use: Not Currently    Comment: occassionally    Current Medications[1]  Allergies[2]  I personally reviewed active problem list, medication list, allergies, family history with the patient/caregiver today.   ROS  Ten systems reviewed and is negative except as mentioned in HPI    Objective Physical Exam VITALS: BP- 128/84 MEASUREMENTS: Weight- 286.9, BMI- 41.17. CONSTITUTIONAL: Patient appears well-developed and well-nourished. No distress. HEENT: Head  atraumatic, normocephalic, neck supple. CARDIOVASCULAR: Normal rate, regular rhythm and normal heart sounds. No murmur heard. No BLE edema. PULMONARY: Effort normal and breath sounds normal. No respiratory distress. ABDOMINAL: There is no tenderness or distention. MUSCULOSKELETAL: Normal gait. Without gross motor or sensory deficit. PSYCHIATRIC: Patient has a normal mood and affect. Behavior is normal. Judgment and thought content normal.  Vitals:   09/11/24 0901  BP: 128/84  Pulse: 74  Resp: 16  SpO2: 96%  Weight: 286 lb 14.4 oz (130.1 kg)   Height: 5' 10 (1.778 m)    Body mass index is 41.17 kg/m.  Recent Results (from the past 2160 hours)  POCT glycosylated hemoglobin (Hb A1C)     Status: None   Collection Time: 09/11/24  9:05 AM  Result Value Ref Range   Hemoglobin A1C 5.4 4.0 - 5.6 %   HbA1c POC (<> result, manual entry)     HbA1c, POC (prediabetic range)     HbA1c, POC (controlled diabetic range)      Diabetic Foot Exam:     PHQ2/9:    09/11/2024    8:54 AM 11/03/2023    9:00 AM 11/24/2021    9:21 AM 10/08/2021    1:14 PM 08/17/2021    2:15 PM  Depression screen PHQ 2/9  Decreased Interest 0 0 0 0 0  Down, Depressed, Hopeless 0 0 0 0 0  PHQ - 2 Score 0 0 0 0 0  Altered sleeping   0    Tired, decreased energy   0    Change in appetite   0    Feeling bad or failure about yourself    0    Trouble concentrating   0    Moving slowly or fidgety/restless   0    Suicidal thoughts   0    PHQ-9 Score   0        Data saved with a previous flowsheet row definition    phq 9 is negative  Fall Risk:    09/11/2024    8:54 AM 11/24/2021    9:20 AM 10/08/2021    1:14 PM 08/17/2021    2:15 PM 03/03/2021    2:44 PM  Fall Risk   Falls in the past year? 0 0 0 0 0  Number falls in past yr: 0 0 0  0  Injury with Fall? 0 0  0   0   Risk for fall due to : No Fall Risks No Fall Risks     Follow up Falls evaluation completed Falls prevention discussed  Falls evaluation completed  Falls prevention discussed       Data saved with a previous flowsheet row definition     Assessment & Plan Type 2 diabetes mellitus with associated dyslipidemia  Well-controlled with A1c of 5.4%. Mounjaro  7.5 mg effective. - Continue Mounjaro  7.5 mg daily. - Ensure adequate protein intake, aiming for 100 grams per day.  Obesity/HTN/DM syndrome Morbid obesity ( BMI over 40) Significant weight loss from 313 lbs to 286 lbs since July. Motivated to lose additional 8 lbs before February 8th. - Continue current weight loss strategies,  including portion control and increased physical activity. - Encouraged participation in scheduled water aerobics and Pilates. - Aim for a total weight loss of 50 lbs before February 8th.  Essential hypertension Well-controlled with valsartan  HCTZ 80/12.5 mg. Blood pressure 128/84 mmHg. - Continue valsartan  HCTZ 80/12.5 mg daily.  Dyslipidemia Low HDL levels, risk factor for heart disease. LDL levels  normal. Prefers to delay statin therapy until age 23. - Encouraged dietary modifications to increase HDL, including consuming fish twice a week and nuts like pecans, pistachios, and almonds. - Will reassess lipid levels at next visit.  General Health Maintenance Due for colonoscopy and mammogram. Discussed importance of pneumonia vaccine due to high-risk status from obesity and diabetes. - Ordered colonoscopy and mammogram. - Encouraged scheduling of eye exam. - Discussed pneumonia vaccine; encouraged consideration and administration.        [1]  Current Outpatient Medications:    Cholecalciferol  (VITAMIN D ) 50 MCG (2000 UT) CAPS, Take 1 capsule by mouth daily., Disp: , Rfl:    tirzepatide  (MOUNJARO ) 7.5 MG/0.5ML Pen, Inject 7.5 mg into the skin once a week., Disp: 6 mL, Rfl: 1   valsartan -hydrochlorothiazide  (DIOVAN -HCT) 80-12.5 MG tablet, Take 1 tablet by mouth daily., Disp: 90 tablet, Rfl: 1 [2]  Allergies Allergen Reactions   Sulfamethoxazole-Trimethoprim Nausea And Vomiting and Other (See Comments)    Headache, Dizziness, Lightlessness, Sweating, Nausea

## 2024-09-12 ENCOUNTER — Other Ambulatory Visit: Payer: Self-pay

## 2024-09-12 ENCOUNTER — Telehealth: Payer: Self-pay

## 2024-09-12 DIAGNOSIS — Z1211 Encounter for screening for malignant neoplasm of colon: Secondary | ICD-10-CM

## 2024-09-12 NOTE — Telephone Encounter (Signed)
 Gastroenterology Pre-Procedure Review  Request Date: 01/03/25 Requesting Physician: Dr. Jinny  PATIENT REVIEW QUESTIONS: The patient responded to the following health history questions as indicated:    1. Are you having any GI issues? no 2. Do you have a personal history of Polyps? no 3. Do you have a family history of Colon Cancer or Polyps? no 4. Diabetes Mellitus? yes (takes mounjaro  has been advised to stop 7 days prior) 5. Joint replacements in the past 12 months?no 6. Major health problems in the past 3 months?no 7. Any artificial heart valves, MVP, or defibrillator?no    MEDICATIONS & ALLERGIES:    Patient reports the following regarding taking any anticoagulation/antiplatelet therapy:   Plavix, Coumadin, Eliquis, Xarelto, Lovenox, Pradaxa, Brilinta, or Effient? no Aspirin? no  Patient confirms/reports the following medications:  Current Outpatient Medications  Medication Sig Dispense Refill   Cholecalciferol  (VITAMIN D ) 50 MCG (2000 UT) CAPS Take 1 capsule by mouth daily.     tirzepatide  (MOUNJARO ) 7.5 MG/0.5ML Pen Inject 7.5 mg into the skin once a week. 6 mL 1   valsartan -hydrochlorothiazide  (DIOVAN -HCT) 80-12.5 MG tablet Take 1 tablet by mouth daily. 90 tablet 1   No current facility-administered medications for this visit.    Patient confirms/reports the following allergies:  Allergies[1]  No orders of the defined types were placed in this encounter.   AUTHORIZATION INFORMATION Primary Insurance: 1D#: Group #:  Secondary Insurance: 1D#: Group #:  SCHEDULE INFORMATION: Date: 01/03/25 Time: Location: [facility]    [1]  Allergies Allergen Reactions   Sulfamethoxazole-Trimethoprim Nausea And Vomiting and Other (See Comments)    Headache, Dizziness, Lightlessness, Sweating, Nausea

## 2024-10-12 ENCOUNTER — Ambulatory Visit: Admitting: Family Medicine

## 2024-10-12 ENCOUNTER — Encounter: Payer: Self-pay | Admitting: Family Medicine

## 2024-10-12 VITALS — BP 124/80 | HR 76 | Resp 16 | Ht 70.0 in | Wt 289.2 lb

## 2024-10-12 DIAGNOSIS — E119 Type 2 diabetes mellitus without complications: Secondary | ICD-10-CM

## 2024-10-12 DIAGNOSIS — R21 Rash and other nonspecific skin eruption: Secondary | ICD-10-CM

## 2024-10-12 DIAGNOSIS — Z7985 Long-term (current) use of injectable non-insulin antidiabetic drugs: Secondary | ICD-10-CM

## 2024-10-12 DIAGNOSIS — I10 Essential (primary) hypertension: Secondary | ICD-10-CM

## 2024-10-12 DIAGNOSIS — Z6841 Body Mass Index (BMI) 40.0 and over, adult: Secondary | ICD-10-CM

## 2024-10-12 DIAGNOSIS — E66812 Obesity, class 2: Secondary | ICD-10-CM

## 2024-10-12 NOTE — Progress Notes (Signed)
 Name: Bridget Weiss   MRN: [id number]    DOB: 1975-11-24   Date:10/12/2024       Progress Note  Subjective  Chief Complaint  Chief Complaint  Patient presents with   vein    Pt states has broken veins and are noticable    Discussed the use of AI scribe software for clinical note transcription with the patient, who gave verbal consent to proceed.  History of Present Illness Bridget Weiss is a 49 year old female with diabetes who presents with concerns about bumps and redness on her legs.  She noticed bumps and redness on the back of her calves and thighs, resembling broken veins, which appeared after a cruise in early January. Her husband first noticed the changes when she wore shorts and documented the condition with pictures.  The symptoms were present on one of the photos from the cruise  She engaged in extensive walking during the cruise, up to twelve miles a day. The bumps do not cause pain, itching, or burning.  She denies using any new skin products and has not tried any topical medications since noticing the symptoms. She is concerned about the appearance of her legs but notes that the condition does not cause discomfort.  She has a history of diabetes and does not regularly check her blood sugar but tries to maintain a balanced diet, including green smoothies as meal replacements. She gained two and a half pounds during the cruise but is not overly concerned about this change. HTN is under control with medications, weight stable even post cruise and taking Mounjaro  as prescribed for DM    Patient Active Problem List   Diagnosis Date Noted   Hypertension, benign 08/21/2020   Obesity, diabetes, and hypertension syndrome (HCC) 07/10/2015   Family history of breast cancer 07/10/2015   Family history of diabetes mellitus 07/10/2015   Morbid obesity (HCC) 07/10/2015   Vitamin D  deficiency 07/10/2015    Social History   Tobacco Use   Smoking status: Never   Smokeless tobacco:  Never  Substance Use Topics   Alcohol use: Not Currently    Comment: occassionally    Current Medications[1]  Allergies[2]  ROS  Ten systems reviewed and is negative except as mentioned in HPI    Objective  Vitals:   10/12/24 0919  BP: 124/80  Pulse: 76  Resp: 16  SpO2: 98%  Weight: 289 lb 3.2 oz (131.2 kg)  Height: 5' 10 (1.778 m)    Body mass index is 41.5 kg/m.    Physical Exam CONSTITUTIONAL: Patient appears well-developed and well-nourished. No distress. HEENT: Head atraumatic, normocephalic, neck supple. CARDIOVASCULAR: Normal rate, regular rhythm and normal heart sounds. No murmur heard. No BLE edema.  SKIN: Redness in streaks with some nodules on posterior calves and less on posterior thighs. PULMONARY: Effort normal and breath sounds normal. No respiratory distress. MUSCULOSKELETAL: Normal gait. Without gross motor or sensory deficit. PSYCHIATRIC: Patient has a normal mood and affect. Behavior is normal. Judgment and thought content normal.     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 No infection. Expected to resolve spontaneously. - Apply hydrocortisone  with lotion to the bumpy area on the calf. - Use compression stockings to aid in resolution. - Elevate legs to reduce symptoms.  Obesity, diabetes, and hypertension syndrome Blood sugar levels controlled despite recent increased activity and dietary changes. - Continue current management plan for obesity, diabetes, and hypertension. - Maintain balanced diet and monitor blood sugar  levels.          [1]  Current Outpatient Medications:    Cholecalciferol  (VITAMIN D ) 50 MCG (2000 UT) CAPS, Take 1 capsule by mouth daily., Disp: , Rfl:    tirzepatide  (MOUNJARO ) 7.5 MG/0.5ML Pen, Inject 7.5 mg into the skin once a week., Disp: 6 mL, Rfl: 1   valsartan -hydrochlorothiazide  (DIOVAN -HCT) 80-12.5 MG tablet, Take 1 tablet by mouth daily., Disp: 90 tablet, Rfl: 1 [2]  Allergies Allergen Reactions   Sulfamethoxazole-Trimethoprim Nausea And Vomiting and Other (See Comments)    Headache, Dizziness, Lightlessness, Sweating, Nausea

## 2025-01-03 ENCOUNTER — Ambulatory Visit: Admit: 2025-01-03 | Admitting: Gastroenterology

## 2025-01-03 SURGERY — COLONOSCOPY
Anesthesia: General

## 2025-03-05 ENCOUNTER — Encounter: Admitting: Family Medicine
